# Patient Record
Sex: Female | Born: 1970 | Race: Black or African American | Hispanic: No | Marital: Married | Smoking: Never smoker
Health system: Southern US, Community
[De-identification: ages and names within clinical notes are randomized; demographics above are authoritative.]

## PROBLEM LIST (undated history)

## (undated) DIAGNOSIS — N12 Tubulo-interstitial nephritis, not specified as acute or chronic: Secondary | ICD-10-CM

## (undated) DIAGNOSIS — G43909 Migraine, unspecified, not intractable, without status migrainosus: Secondary | ICD-10-CM

## (undated) HISTORY — PX: WISDOM TOOTH EXTRACTION: SHX21

## (undated) HISTORY — PX: TUBAL LIGATION: SHX77

---

## 2003-03-03 ENCOUNTER — Other Ambulatory Visit: Admission: RE | Admit: 2003-03-03 | Discharge: 2003-03-03 | Payer: Self-pay | Admitting: Internal Medicine

## 2004-08-08 ENCOUNTER — Ambulatory Visit: Payer: Self-pay | Admitting: Internal Medicine

## 2005-05-14 ENCOUNTER — Emergency Department (HOSPITAL_COMMUNITY): Admission: EM | Admit: 2005-05-14 | Discharge: 2005-05-14 | Payer: Self-pay | Admitting: Emergency Medicine

## 2006-02-19 ENCOUNTER — Other Ambulatory Visit: Admission: RE | Admit: 2006-02-19 | Discharge: 2006-02-19 | Payer: Self-pay | Admitting: Internal Medicine

## 2006-06-09 ENCOUNTER — Emergency Department (HOSPITAL_COMMUNITY): Admission: EM | Admit: 2006-06-09 | Discharge: 2006-06-09 | Payer: Self-pay | Admitting: Family Medicine

## 2007-11-07 ENCOUNTER — Emergency Department (HOSPITAL_COMMUNITY): Admission: EM | Admit: 2007-11-07 | Discharge: 2007-11-07 | Payer: Self-pay | Admitting: Obstetrics & Gynecology

## 2007-12-26 ENCOUNTER — Other Ambulatory Visit: Admission: RE | Admit: 2007-12-26 | Discharge: 2007-12-26 | Payer: Self-pay | Admitting: Internal Medicine

## 2008-02-20 ENCOUNTER — Ambulatory Visit (HOSPITAL_COMMUNITY): Admission: RE | Admit: 2008-02-20 | Discharge: 2008-02-20 | Payer: Self-pay | Admitting: Internal Medicine

## 2009-06-11 ENCOUNTER — Ambulatory Visit: Payer: Self-pay | Admitting: Internal Medicine

## 2009-07-07 ENCOUNTER — Ambulatory Visit (HOSPITAL_COMMUNITY): Admission: RE | Admit: 2009-07-07 | Discharge: 2009-07-07 | Payer: Self-pay | Admitting: Internal Medicine

## 2009-07-20 ENCOUNTER — Ambulatory Visit: Payer: Self-pay | Admitting: Internal Medicine

## 2009-07-20 ENCOUNTER — Other Ambulatory Visit: Admission: RE | Admit: 2009-07-20 | Discharge: 2009-07-20 | Payer: Self-pay | Admitting: Internal Medicine

## 2010-07-15 ENCOUNTER — Ambulatory Visit: Payer: Self-pay | Admitting: Internal Medicine

## 2010-07-15 ENCOUNTER — Other Ambulatory Visit: Admission: RE | Admit: 2010-07-15 | Discharge: 2010-07-15 | Payer: Self-pay | Admitting: Internal Medicine

## 2010-08-09 ENCOUNTER — Ambulatory Visit (HOSPITAL_COMMUNITY)
Admission: RE | Admit: 2010-08-09 | Discharge: 2010-08-09 | Payer: Self-pay | Source: Home / Self Care | Admitting: Internal Medicine

## 2011-01-13 ENCOUNTER — Telehealth: Payer: Self-pay | Admitting: Pulmonary Disease

## 2011-01-13 NOTE — Telephone Encounter (Signed)
Error.Andrea Wagner ° ° °

## 2012-02-13 ENCOUNTER — Other Ambulatory Visit: Payer: BC Managed Care – PPO | Admitting: Internal Medicine

## 2012-02-13 DIAGNOSIS — Z0000 Encounter for general adult medical examination without abnormal findings: Secondary | ICD-10-CM

## 2012-02-13 DIAGNOSIS — E559 Vitamin D deficiency, unspecified: Secondary | ICD-10-CM

## 2012-02-13 LAB — LIPID PANEL: Cholesterol: 168 mg/dL (ref 0–200)

## 2012-02-13 LAB — TSH: TSH: 0.694 u[IU]/mL (ref 0.350–4.500)

## 2012-02-13 LAB — CBC WITH DIFFERENTIAL/PLATELET
Basophils Absolute: 0 10*3/uL (ref 0.0–0.1)
Basophils Relative: 0 % (ref 0–1)
Hemoglobin: 12.7 g/dL (ref 12.0–15.0)
MCHC: 33.1 g/dL (ref 30.0–36.0)
Monocytes Relative: 9 % (ref 3–12)
Neutro Abs: 4.6 10*3/uL (ref 1.7–7.7)
Neutrophils Relative %: 68 % (ref 43–77)
RDW: 13.8 % (ref 11.5–15.5)
WBC: 6.7 10*3/uL (ref 4.0–10.5)

## 2012-02-14 LAB — COMPREHENSIVE METABOLIC PANEL
AST: 15 U/L (ref 0–37)
Albumin: 4 g/dL (ref 3.5–5.2)
BUN: 11 mg/dL (ref 6–23)
CO2: 30 mEq/L (ref 19–32)
Calcium: 9.4 mg/dL (ref 8.4–10.5)
Chloride: 106 mEq/L (ref 96–112)
Potassium: 4.6 mEq/L (ref 3.5–5.3)

## 2012-02-15 ENCOUNTER — Ambulatory Visit (INDEPENDENT_AMBULATORY_CARE_PROVIDER_SITE_OTHER): Payer: BC Managed Care – PPO | Admitting: Internal Medicine

## 2012-02-15 ENCOUNTER — Encounter: Payer: Self-pay | Admitting: Internal Medicine

## 2012-02-15 ENCOUNTER — Other Ambulatory Visit (HOSPITAL_COMMUNITY)
Admission: RE | Admit: 2012-02-15 | Discharge: 2012-02-15 | Disposition: A | Discharge status: Home / Self Care | Payer: BC Managed Care – PPO | Source: Ambulatory Visit | Attending: Internal Medicine | Admitting: Internal Medicine

## 2012-02-15 VITALS — BP 96/76 | HR 84 | Temp 98.9°F | Ht 65.5 in | Wt 203.0 lb

## 2012-02-15 DIAGNOSIS — Z124 Encounter for screening for malignant neoplasm of cervix: Secondary | ICD-10-CM

## 2012-02-15 DIAGNOSIS — Z01419 Encounter for gynecological examination (general) (routine) without abnormal findings: Secondary | ICD-10-CM | POA: Insufficient documentation

## 2012-02-15 DIAGNOSIS — E559 Vitamin D deficiency, unspecified: Secondary | ICD-10-CM

## 2012-02-15 DIAGNOSIS — Z8669 Personal history of other diseases of the nervous system and sense organs: Secondary | ICD-10-CM

## 2012-02-15 DIAGNOSIS — Z8639 Personal history of other endocrine, nutritional and metabolic disease: Secondary | ICD-10-CM

## 2012-02-15 DIAGNOSIS — Z87898 Personal history of other specified conditions: Secondary | ICD-10-CM

## 2012-02-15 DIAGNOSIS — Z0000 Encounter for general adult medical examination without abnormal findings: Secondary | ICD-10-CM

## 2012-02-15 LAB — POCT URINALYSIS DIPSTICK
Bilirubin, UA: NEGATIVE
Ketones, UA: NEGATIVE
Leukocytes, UA: NEGATIVE

## 2012-02-29 ENCOUNTER — Telehealth: Payer: Self-pay | Admitting: Internal Medicine

## 2012-02-29 NOTE — Telephone Encounter (Signed)
Recent pap smear revealed trichomonads: treat pt and husband with Flagyl 500 mg twice daily x 7 days #14. Husband is Cherokee Boccio DOB 10/06/72. Called Rx to [facility].

## 2012-03-05 ENCOUNTER — Encounter: Payer: Self-pay | Admitting: Internal Medicine

## 2012-03-05 DIAGNOSIS — Z8669 Personal history of other diseases of the nervous system and sense organs: Secondary | ICD-10-CM | POA: Insufficient documentation

## 2012-03-05 DIAGNOSIS — E559 Vitamin D deficiency, unspecified: Secondary | ICD-10-CM | POA: Insufficient documentation

## 2012-03-05 NOTE — Patient Instructions (Addendum)
Take Drisdol 50,000 units weekly for 12 weeks and take vitamin D 3 2000 units daily. Have annual mammogram. Take Fioricet sparingly for migraine headaches. Return in one year

## 2012-03-05 NOTE — Progress Notes (Signed)
  Subjective:    Patient ID: Andrea Wagner, female    DOB: 01/11/1971, 41 y.o.   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 History of migraine headaches, irritable bowel syndrome, GE reflux and vitamin D deficiency. She works for [employer]. Menstrual periods are irregular always last 3-5 days and she is passing some heavy clots. Says she had tetanus immunization in 2004. Migraines are under fairly good control. Has maybe one migraine a month. Takes Fioricet for migraines. Sulfa causes a rash.  Patient had tubal ligation 1997. Social history married has one son and one daughter. Son has Crohn's disease. Patient does not smoke or consume alcohol.  Family history: Father with history of stroke hypertension and diabetes. Mother with history of arteritis and hypertension.  Review of systems: Has seen Dr. Thurston Hole for knee pain in the past. History of chondromalacia and varicose veins.    Review of Systems  Constitutional: Positive for fatigue.  HENT: Negative.   Eyes: Negative.   Respiratory: Negative.   Cardiovascular: Negative.   Gastrointestinal:       History of GE reflux  Genitourinary:       History of heavy menses  Neurological:       History of migraine headaches  Hematological: Negative.   Psychiatric/Behavioral: Negative.        Objective:   Physical Exam  Vitals reviewed. Constitutional: She is oriented to person, place, and time. She appears well-developed and well-nourished. No distress.  HENT:  Head: Normocephalic and atraumatic.  Right Ear: External ear normal.  Left Ear: External ear normal.  Nose: Nose normal.  Mouth/Throat: Oropharynx is clear and moist. No oropharyngeal exudate.  Eyes: Conjunctivae and EOM are normal. Pupils are equal, round, and reactive to light. Right eye exhibits no discharge. Left eye exhibits no discharge. No scleral icterus.  Neck: Normal range of motion. Neck supple. No JVD present.  No tracheal deviation present. No thyromegaly present.  Cardiovascular: Normal rate, regular rhythm, normal heart sounds and intact distal pulses.   No murmur heard. Pulmonary/Chest: Effort normal and breath sounds normal. She has no wheezes. She has no rales.       Breasts normal female  Abdominal: Soft. Bowel sounds are normal. She exhibits no distension and no mass. There is no tenderness. There is no rebound and no guarding.  Genitourinary: Vagina normal and uterus normal.       Pap taken  Musculoskeletal: She exhibits no edema.  Lymphadenopathy:    She has no cervical adenopathy.  Neurological: She is alert and oriented to person, place, and time. She has normal reflexes. No cranial nerve deficit. Coordination normal.  Skin: Skin is warm and dry. No rash noted. She is not diaphoretic.  Psychiatric: She has a normal mood and affect. Her behavior is normal. Judgment and thought content normal.          Assessment & Plan:  History of migraine headaches  History of vitamin D deficiency  Plan: Patient is vitamin D deficient at the present time recommend 50,000 units weekly for 12 weeks in 2000 units daily. Refilled Fioricet for migraine headaches. Return one year or as needed. Recommend annual mammogram beginning nail. Patient had tetanus immunization 03/03/2003. Gets annual influenza immunization through employment. Had mammogram December 2011.

## 2012-05-02 ENCOUNTER — Other Ambulatory Visit: Payer: Self-pay | Admitting: Internal Medicine

## 2012-05-02 DIAGNOSIS — Z1231 Encounter for screening mammogram for malignant neoplasm of breast: Secondary | ICD-10-CM

## 2012-05-10 ENCOUNTER — Ambulatory Visit (HOSPITAL_COMMUNITY)
Admission: RE | Admit: 2012-05-10 | Discharge: 2012-05-10 | Disposition: A | Discharge status: Home / Self Care | Payer: BC Managed Care – PPO | Source: Ambulatory Visit | Attending: Internal Medicine | Admitting: Internal Medicine

## 2012-05-10 DIAGNOSIS — Z1231 Encounter for screening mammogram for malignant neoplasm of breast: Secondary | ICD-10-CM | POA: Insufficient documentation

## 2013-06-03 ENCOUNTER — Other Ambulatory Visit: Payer: BC Managed Care – PPO | Admitting: Internal Medicine

## 2013-06-03 DIAGNOSIS — Z130 Encounter for screening for diseases of the blood and blood-forming organs and certain disorders involving the immune mechanism: Secondary | ICD-10-CM

## 2013-06-03 DIAGNOSIS — Z13220 Encounter for screening for lipoid disorders: Secondary | ICD-10-CM

## 2013-06-03 DIAGNOSIS — Z1329 Encounter for screening for other suspected endocrine disorder: Secondary | ICD-10-CM

## 2013-06-03 DIAGNOSIS — Z0000 Encounter for general adult medical examination without abnormal findings: Secondary | ICD-10-CM

## 2013-06-03 LAB — CBC WITH DIFFERENTIAL/PLATELET
Basophils Absolute: 0 10*3/uL (ref 0.0–0.1)
HCT: 36.2 % (ref 36.0–46.0)
Hemoglobin: 12.3 g/dL (ref 12.0–15.0)
Lymphocytes Relative: 12 % (ref 12–46)
Monocytes Absolute: 0.6 10*3/uL (ref 0.1–1.0)
Monocytes Relative: 9 % (ref 3–12)
Neutro Abs: 5.7 10*3/uL (ref 1.7–7.7)
RDW: 14.3 % (ref 11.5–15.5)
WBC: 7.2 10*3/uL (ref 4.0–10.5)

## 2013-06-03 LAB — COMPREHENSIVE METABOLIC PANEL
AST: 12 U/L (ref 0–37)
Albumin: 4.1 g/dL (ref 3.5–5.2)
BUN: 9 mg/dL (ref 6–23)
Calcium: 9.1 mg/dL (ref 8.4–10.5)
Chloride: 105 mEq/L (ref 96–112)
Potassium: 3.9 mEq/L (ref 3.5–5.3)
Total Protein: 6.6 g/dL (ref 6.0–8.3)

## 2013-06-03 LAB — LIPID PANEL: HDL: 77 mg/dL (ref >39)

## 2013-06-03 LAB — TSH: TSH: 0.783 u[IU]/mL (ref 0.350–4.500)

## 2013-06-04 LAB — VITAMIN D 25 HYDROXY (VIT D DEFICIENCY, FRACTURES): Vit D, 25-Hydroxy: 34 ng/mL (ref 30–89)

## 2013-06-09 ENCOUNTER — Encounter: Payer: Self-pay | Admitting: Internal Medicine

## 2013-06-09 ENCOUNTER — Other Ambulatory Visit (HOSPITAL_COMMUNITY)
Admission: RE | Admit: 2013-06-09 | Discharge: 2013-06-09 | Disposition: A | Discharge status: Home / Self Care | Payer: BC Managed Care – PPO | Source: Ambulatory Visit | Attending: Internal Medicine | Admitting: Internal Medicine

## 2013-06-09 ENCOUNTER — Ambulatory Visit (INDEPENDENT_AMBULATORY_CARE_PROVIDER_SITE_OTHER): Payer: BC Managed Care – PPO | Admitting: Internal Medicine

## 2013-06-09 VITALS — BP 116/86 | HR 80 | Temp 98.6°F | Ht 66.5 in | Wt 200.0 lb

## 2013-06-09 DIAGNOSIS — Z01419 Encounter for gynecological examination (general) (routine) without abnormal findings: Secondary | ICD-10-CM | POA: Insufficient documentation

## 2013-06-09 DIAGNOSIS — M25569 Pain in unspecified knee: Secondary | ICD-10-CM

## 2013-06-09 DIAGNOSIS — M25561 Pain in right knee: Secondary | ICD-10-CM | POA: Insufficient documentation

## 2013-06-09 DIAGNOSIS — Z8669 Personal history of other diseases of the nervous system and sense organs: Secondary | ICD-10-CM

## 2013-06-09 DIAGNOSIS — Z0000 Encounter for general adult medical examination without abnormal findings: Secondary | ICD-10-CM

## 2013-06-09 DIAGNOSIS — Z8639 Personal history of other endocrine, nutritional and metabolic disease: Secondary | ICD-10-CM

## 2013-06-09 LAB — POCT URINALYSIS DIPSTICK
Glucose, UA: NEGATIVE
Leukocytes, UA: NEGATIVE
Nitrite, UA: NEGATIVE
Spec Grav, UA: 1.025
Urobilinogen, UA: NEGATIVE

## 2013-06-09 MED ORDER — BUTALBITAL-APAP-CAFFEINE 50-325-40 MG PO TABS: 1.0000 | ORAL_TABLET | Freq: Two times a day (BID) | ORAL | Status: DC | PRN

## 2013-06-09 MED ORDER — IBUPROFEN 800 MG PO TABS: 800.0000 mg | ORAL_TABLET | Freq: Three times a day (TID) | ORAL | Status: DC | PRN

## 2013-06-09 NOTE — Patient Instructions (Addendum)
Return in one year. Have refilled Fioricet and ibuprofen.

## 2013-06-09 NOTE — Progress Notes (Signed)
  Subjective:    Patient ID: Andrea Wagner, female    DOB: [birth year], 42 y.o.   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 She has a history of migraine headaches. Usually can take Excedrin migraine for relief but sometimes has to take Fioricet. Fioricet was refilled today. History of vitamin D deficiency. Recently went on cruise. Doesn't remember to take vitamin D 3 on a daily basis. Her vitamin D level is normal. Suggested once again she take vitamin D 3 2000 units daily. Complains of fatigue but when I review her schedule with her, I can understand why. She's taking classes at [facility] [employer] as prerequisites for obtaining her BSN. She works every other weekend at [employer]. She works full-time for [employer]. She also has a family.  Past medical history: Patient had tubal ligation 1997. Has seen Dr. Thurston Hole for knee pain in the past with history of chondromalacia patella and varicose veins.  Social history: Married. Has one son and one daughter. Son has Crohn's disease. Patient does not smoke or consume alcohol.  Family history: Father with history of stroke, hypertension, diabetes. Mother with history of arthritis and hypertension    Review of Systems  Constitutional: Positive for fatigue.  HENT: Negative.   Eyes: Negative.   Respiratory: Negative.   Cardiovascular: Negative.   Gastrointestinal: Negative.   Endocrine: Negative.   Genitourinary: Negative.   Musculoskeletal:       Bilateral knee pain. History of chondromalacia patella.  Allergic/Immunologic: Negative.   Neurological:       History of migraine headaches  Hematological: Negative.   Psychiatric/Behavioral: Negative.        Objective:   Physical Exam  Vitals reviewed. Constitutional: She is oriented to person, place, and time. She appears well-developed and well-nourished. No distress.  HENT:  Head:  Atraumatic.  Right Ear: External ear normal.  Left Ear: External ear normal.  Mouth/Throat: Oropharynx is clear and moist. No oropharyngeal exudate.  Eyes: Conjunctivae and EOM are normal. Pupils are equal, round, and reactive to light. Right eye exhibits no discharge. Left eye exhibits no discharge. No scleral icterus.  Neck: Neck supple. No JVD present. No thyromegaly present.  Cardiovascular: Normal rate, regular rhythm, normal heart sounds and intact distal pulses.   No murmur heard. Pulmonary/Chest: Effort normal and breath sounds normal. No respiratory distress. She has no wheezes. She has no rales. She exhibits no tenderness.  Breasts normal female without masses  Abdominal: Soft. Bowel sounds are normal. She exhibits no distension and no mass. There is no tenderness. There is no rebound and no guarding.  Genitourinary: Vagina normal and uterus normal.  Pap taken  Musculoskeletal: Normal range of motion. She exhibits no edema.  Lymphadenopathy:    She has no cervical adenopathy.  Neurological: She is alert and oriented to person, place, and time. She has normal reflexes. No cranial nerve deficit. Coordination normal.  Skin: Skin is warm and dry. No rash noted. She is not diaphoretic.  Psychiatric: She has a normal mood and affect. Her behavior is normal. Judgment and thought content normal.          Assessment & Plan:  History of migraine headaches-refill Fioricet as described above  Fatigue  History of vitamin D deficiency-take vitamin D supplement over-the-counter as directed  Bilateral knee pain-refill ibuprofen  Plan: Return in one year or as needed

## 2013-07-18 ENCOUNTER — Emergency Department (HOSPITAL_COMMUNITY)
Admission: EM | Admit: 2013-07-18 | Discharge: 2013-07-18 | Disposition: A | Discharge status: Home / Self Care | Payer: No Typology Code available for payment source | Attending: Emergency Medicine | Admitting: Emergency Medicine

## 2013-07-18 DIAGNOSIS — S0003XA Contusion of scalp, initial encounter: Secondary | ICD-10-CM | POA: Insufficient documentation

## 2013-07-18 DIAGNOSIS — Y92410 Unspecified street and highway as the place of occurrence of the external cause: Secondary | ICD-10-CM | POA: Insufficient documentation

## 2013-07-18 DIAGNOSIS — S060X0A Concussion without loss of consciousness, initial encounter: Secondary | ICD-10-CM | POA: Insufficient documentation

## 2013-07-18 DIAGNOSIS — IMO0002 Reserved for concepts with insufficient information to code with codable children: Secondary | ICD-10-CM | POA: Insufficient documentation

## 2013-07-18 DIAGNOSIS — S0993XA Unspecified injury of face, initial encounter: Secondary | ICD-10-CM | POA: Insufficient documentation

## 2013-07-18 DIAGNOSIS — Y9389 Activity, other specified: Secondary | ICD-10-CM | POA: Insufficient documentation

## 2013-07-18 MED ORDER — ONDANSETRON 4 MG PO TBDP
4.0000 mg | ORAL_TABLET | Freq: Once | ORAL | Status: AC
  Administered 2013-07-18: 4 mg via ORAL
  Filled 2013-07-18: qty 1

## 2013-07-18 MED ORDER — HYDROCODONE-ACETAMINOPHEN 5-325 MG PO TABS
2.0000 | ORAL_TABLET | Freq: Once | ORAL | Status: AC
  Administered 2013-07-18: 2 via ORAL
  Filled 2013-07-18: qty 2
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
## 2013-07-18 NOTE — ED Notes (Signed)
Pt has a ride home.  

## 2013-07-18 NOTE — ED Notes (Signed)
Pt sitting in darkened room. States she is having a headache with slight nausea. Pt also states her lower back is beginning to throb and that her shoulders feel tight. Pt a/o x 4.
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
## 2013-07-19 NOTE — ED Provider Notes (Signed)
Medical screening examination/treatment/procedure(s) were performed by non-physician practitioner and as supervising physician I was immediately available for consultation/collaboration.    Posey Petrik R Mahli Glahn, MD 07/19/13 0009 

## 2013-07-21 ENCOUNTER — Encounter: Payer: Self-pay | Admitting: Internal Medicine

## 2013-07-21 ENCOUNTER — Ambulatory Visit
Admission: RE | Admit: 2013-07-21 | Discharge: 2013-07-21 | Disposition: A | Discharge status: Home / Self Care | Payer: Self-pay | Source: Ambulatory Visit | Attending: Internal Medicine | Admitting: Internal Medicine

## 2013-07-21 ENCOUNTER — Ambulatory Visit (INDEPENDENT_AMBULATORY_CARE_PROVIDER_SITE_OTHER): Payer: BC Managed Care – PPO | Admitting: Internal Medicine

## 2013-07-21 VITALS — BP 112/82 | HR 72 | Temp 97.9°F | Ht 66.0 in | Wt 197.0 lb

## 2013-07-21 DIAGNOSIS — M542 Cervicalgia: Secondary | ICD-10-CM

## 2013-07-21 DIAGNOSIS — M25519 Pain in unspecified shoulder: Secondary | ICD-10-CM

## 2013-07-21 DIAGNOSIS — M62838 Other muscle spasm: Secondary | ICD-10-CM

## 2013-07-21 DIAGNOSIS — Z5189 Encounter for other specified aftercare: Secondary | ICD-10-CM

## 2013-07-21 DIAGNOSIS — M25511 Pain in right shoulder: Secondary | ICD-10-CM

## 2013-07-21 DIAGNOSIS — R51 Headache: Secondary | ICD-10-CM

## 2013-07-21 DIAGNOSIS — S0990XD Unspecified injury of head, subsequent encounter: Secondary | ICD-10-CM

## 2013-07-21 MED ORDER — HYDROCODONE-ACETAMINOPHEN 5-325 MG PO TABS: 1.0000 | ORAL_TABLET | Freq: Four times a day (QID) | ORAL | Status: DC | PRN

## 2013-07-21 MED ORDER — CYCLOBENZAPRINE HCL 10 MG PO TABS: ORAL_TABLET | ORAL | Status: DC
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
## 2013-08-01 NOTE — Patient Instructions (Signed)
Take hydrocodone/APAP as directed. Take Flexeril at bedtime. Have cervical spine films done. Apply ice to neck spasm 20 minutes once or twice daily. Call if not better in 7-10 days or sooner if worse.

## 2013-10-14 ENCOUNTER — Telehealth: Payer: Self-pay | Admitting: Internal Medicine
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
## 2014-03-29 ENCOUNTER — Emergency Department (INDEPENDENT_AMBULATORY_CARE_PROVIDER_SITE_OTHER)
Admission: EM | Admit: 2014-03-29 | Discharge: 2014-03-29 | Disposition: A | Discharge status: Home / Self Care | Payer: BC Managed Care – PPO | Source: Home / Self Care | Attending: Family Medicine | Admitting: Family Medicine

## 2014-03-29 ENCOUNTER — Encounter (HOSPITAL_COMMUNITY): Payer: Self-pay | Admitting: Emergency Medicine

## 2014-03-29 DIAGNOSIS — M722 Plantar fascial fibromatosis: Secondary | ICD-10-CM | POA: Diagnosis present

## 2014-03-29 DIAGNOSIS — Q667 Congenital pes cavus, unspecified foot: Secondary | ICD-10-CM

## 2014-03-29 NOTE — ED Notes (Signed)
Patient complains of pain to her left heel that started about Four days ago and has gotten progressively worse The foot is slightly swollen around the ankle and the pain is a throbbing Pain Denies any injury to her foot

## 2014-03-29 NOTE — Discharge Instructions (Signed)
Thank you for coming in today. Get some gel heel cups Wear cushioned shoes.  Do the stretch and ice massage like we talked about.  Do the heel raise exercises like we talked about. Do 30 reps severe times a day. Remember to go down slowly.  Follow up with Dr. Katrinka BlazingSmith to consider orthotics.    Plantar Fasciitis (Heel Spur Syndrome) with Rehab The plantar fascia is a fibrous, ligament-like, soft-tissue structure that spans the bottom of the foot. Plantar fasciitis is a condition that causes pain in the foot due to inflammation of the tissue. SYMPTOMS   Pain and tenderness on the underneath side of the foot.  Pain that worsens with standing or walking. CAUSES  Plantar fasciitis is caused by irritation and injury to the plantar fascia on the underneath side of the foot. Common mechanisms of injury include:  Direct trauma to bottom of the foot.  Damage to a small nerve that runs under the foot where the main fascia attaches to the heel bone.  Stress placed on the plantar fascia due to bone spurs. RISK INCREASES WITH:   Activities that place stress on the plantar fascia (running, jumping, pivoting, or cutting).  Poor strength and flexibility.  Improperly fitted shoes.  Tight calf muscles.  Flat feet.  Failure to warm-up properly before activity.  Obesity. PREVENTION  Warm up and stretch properly before activity.  Allow for adequate recovery between workouts.  Maintain physical fitness:  Strength, flexibility, and endurance.  Cardiovascular fitness.  Maintain a health body weight.  Avoid stress on the plantar fascia.  Wear properly fitted shoes, including arch supports for individuals who have flat feet. PROGNOSIS  If treated properly, then the symptoms of plantar fasciitis usually resolve without surgery. However, occasionally surgery is necessary. RELATED COMPLICATIONS   Recurrent symptoms that may result in a chronic condition.  Problems of the lower back that  are caused by compensating for the injury, such as limping.  Pain or weakness of the foot during push-off following surgery.  Chronic inflammation, scarring, and partial or complete fascia tear, occurring more often from repeated injections. TREATMENT  Treatment initially involves the use of ice and medication to help reduce pain and inflammation. The use of strengthening and stretching exercises may help reduce pain with activity, especially stretches of the Achilles tendon. These exercises may be performed at home or with a therapist. Your caregiver may recommend that you use heel cups of arch supports to help reduce stress on the plantar fascia. Occasionally, corticosteroid injections are given to reduce inflammation. If symptoms persist for greater than 6 months despite non-surgical (conservative), then surgery may be recommended.  MEDICATION   If pain medication is necessary, then nonsteroidal anti-inflammatory medications, such as aspirin and ibuprofen, or other minor pain relievers, such as acetaminophen, are often recommended.  Do not take pain medication within 7 days before surgery.  Prescription pain relievers may be given if deemed necessary by your caregiver. Use only as directed and only as much as you need.  Corticosteroid injections may be given by your caregiver. These injections should be reserved for the most serious cases, because they may only be given a certain number of times. HEAT AND COLD  Cold treatment (icing) relieves pain and reduces inflammation. Cold treatment should be applied for 10 to 15 minutes every 2 to 3 hours for inflammation and pain and immediately after any activity that aggravates your symptoms. Use ice packs or massage the area with a piece of ice (ice massage).  Heat treatment may be used prior to performing the stretching and strengthening activities prescribed by your caregiver, physical therapist, or athletic trainer. Use a heat pack or soak the  injury in warm water. SEEK IMMEDIATE MEDICAL CARE IF:  Treatment seems to offer no benefit, or the condition worsens.  Any medications produce adverse side effects. EXERCISES RANGE OF MOTION (ROM) AND STRETCHING EXERCISES - Plantar Fasciitis (Heel Spur Syndrome) These exercises may help you when beginning to rehabilitate your injury. Your symptoms may resolve with or without further involvement from your physician, physical therapist or athletic trainer. While completing these exercises, remember:   Restoring tissue flexibility helps normal motion to return to the joints. This allows healthier, less painful movement and activity.  An effective stretch should be held for at least 30 seconds.  A stretch should never be painful. You should only feel a gentle lengthening or release in the stretched tissue. RANGE OF MOTION - Toe Extension, Flexion  Sit with your right / left leg crossed over your opposite knee.  Grasp your toes and gently pull them back toward the top of your foot. You should feel a stretch on the bottom of your toes and/or foot.  Hold this stretch for __________ seconds.  Now, gently pull your toes toward the bottom of your foot. You should feel a stretch on the top of your toes and or foot.  Hold this stretch for __________ seconds. Repeat __________ times. Complete this stretch __________ times per day.  RANGE OF MOTION - Ankle Dorsiflexion, Active Assisted  Remove shoes and sit on a chair that is preferably not on a carpeted surface.  Place right / left foot under knee. Extend your opposite leg for support.  Keeping your heel down, slide your right / left foot back toward the chair until you feel a stretch at your ankle or calf. If you do not feel a stretch, slide your bottom forward to the edge of the chair, while still keeping your heel down.  Hold this stretch for __________ seconds. Repeat __________ times. Complete this stretch __________ times per day.    STRETCH - Gastroc, Standing  Place hands on wall.  Extend right / left leg, keeping the front knee somewhat bent.  Slightly point your toes inward on your back foot.  Keeping your right / left heel on the floor and your knee straight, shift your weight toward the wall, not allowing your back to arch.  You should feel a gentle stretch in the right / left calf. Hold this position for __________ seconds. Repeat __________ times. Complete this stretch __________ times per day. STRETCH - Soleus, Standing  Place hands on wall.  Extend right / left leg, keeping the other knee somewhat bent.  Slightly point your toes inward on your back foot.  Keep your right / left heel on the floor, bend your back knee, and slightly shift your weight over the back leg so that you feel a gentle stretch deep in your back calf.  Hold this position for __________ seconds. Repeat __________ times. Complete this stretch __________ times per day. STRETCH - Gastrocsoleus, Standing  Note: This exercise can place a lot of stress on your foot and ankle. Please complete this exercise only if specifically instructed by your caregiver.   Place the ball of your right / left foot on a step, keeping your other foot firmly on the same step.  Hold on to the wall or a rail for balance.  Slowly lift your other  foot, allowing your body weight to press your heel down over the edge of the step.  You should feel a stretch in your right / left calf.  Hold this position for __________ seconds.  Repeat this exercise with a slight bend in your right / left knee. Repeat __________ times. Complete this stretch __________ times per day.  STRENGTHENING EXERCISES - Plantar Fasciitis (Heel Spur Syndrome)  These exercises may help you when beginning to rehabilitate your injury. They may resolve your symptoms with or without further involvement from your physician, physical therapist or athletic trainer. While completing these  exercises, remember:   Muscles can gain both the endurance and the strength needed for everyday activities through controlled exercises.  Complete these exercises as instructed by your physician, physical therapist or athletic trainer. Progress the resistance and repetitions only as guided. STRENGTH - Towel Curls  Sit in a chair positioned on a non-carpeted surface.  Place your foot on a towel, keeping your heel on the floor.  Pull the towel toward your heel by only curling your toes. Keep your heel on the floor.  If instructed by your physician, physical therapist or athletic trainer, add ____________________ at the end of the towel. Repeat __________ times. Complete this exercise __________ times per day. STRENGTH - Ankle Inversion  Secure one end of a rubber exercise band/tubing to a fixed object (table, pole). Loop the other end around your foot just before your toes.  Place your fists between your knees. This will focus your strengthening at your ankle.  Slowly, pull your big toe up and in, making sure the band/tubing is positioned to resist the entire motion.  Hold this position for __________ seconds.  Have your muscles resist the band/tubing as it slowly pulls your foot back to the starting position. Repeat __________ times. Complete this exercises __________ times per day.  Document Released: 08/21/2005 Document Revised: 11/13/2011 Document Reviewed: 12/03/2008 [facility] Patient Information 2015 [place], [place]. This information is not intended to replace advice given to you by your health care provider. Make sure you discuss any questions you have with your health care provider.

## 2014-03-29 NOTE — ED Provider Notes (Signed)
Andrea Wagner is a 43 y.o. female who presents to Urgent Care today for left heel pain. Patient has a 4 to five-day history of plantar left heel pain. Patient denies any injury. The pain is worse with walking and prolonged standing. She denies any radiating pain weakness or numbness. She's tried ibuprofen rest and elevation which helped only a little. No fevers or chills nausea vomiting or diarrhea.   History reviewed. No pertinent past medical history. History  Substance Use Topics  . Smoking status: Never Smoker   . Smokeless tobacco: Never Used  . Alcohol Use: Yes     Comment: rarely   ROS as above Medications: No current facility-administered medications for this encounter.   Current Outpatient Prescriptions  Medication Sig Dispense Refill  . aspirin-acetaminophen-caffeine (EXCEDRIN MIGRAINE) 250-250-65 MG per tablet Take 1 tablet by mouth every 6 (six) hours as needed (migraine).       . cyclobenzaprine (FLEXERIL) 10 MG tablet 1/2  To one tab po hs  30 tablet  1  . HYDROcodone-acetaminophen (NORCO) 5-325 MG per tablet Take 1 tablet by mouth every 6 (six) hours as needed for moderate pain.  60 tablet  0  . ibuprofen (ADVIL,MOTRIN) 800 MG tablet Take 1 tablet (800 mg total) by mouth every 8 (eight) hours as needed for pain.  90 tablet  3    Exam:  BP 125/78  Pulse 79  Temp(Src) 99.1 F (37.3 C) (Oral)  Resp 16  SpO2 97% Gen: Well NAD Left foot:  Cavus appearing foot without any other significant abnormalities or erythema. Tender palpation medial plantar calcaneus Foot is nontender otherwise Peripheral pulses and sensation are intact Normal foot motion  Contralateral right foot is also cavus but nontender without other abnormalities capillary refill sensation and pulses are also intact on the right side  No results found for this or any previous visit (from the past 24 hour(s)). No results found.  Assessment and Plan: 43 y.o. female with left plantar fasciitis. Plan  for heel cups, supportive shoes, ice massage, and eccentric calf exercises. Patient has a cavus foot which increases her probability of injury.  Recommend followup with sports medicine for consideration of orthotics.   Discussed warning signs or symptoms. Please see discharge instructions. Patient expresses understanding.   This note was created using Conservation officer, historic buildingsDragon voice recognition software. Any transcription errors are unintended.    Rodolph BongEvan S Irl Bodie, MD 03/29/14 [id number]

## 2014-04-30 ENCOUNTER — Ambulatory Visit (HOSPITAL_COMMUNITY)
Admission: RE | Admit: 2014-04-30 | Discharge: 2014-04-30 | Disposition: A | Discharge status: Home / Self Care | Payer: BC Managed Care – PPO | Source: Ambulatory Visit | Attending: Internal Medicine | Admitting: Internal Medicine

## 2014-04-30 ENCOUNTER — Other Ambulatory Visit: Payer: Self-pay | Admitting: Internal Medicine

## 2014-04-30 DIAGNOSIS — Z1231 Encounter for screening mammogram for malignant neoplasm of breast: Secondary | ICD-10-CM | POA: Diagnosis present

## 2014-08-21 ENCOUNTER — Other Ambulatory Visit: Payer: BC Managed Care – PPO | Admitting: Internal Medicine

## 2014-08-21 DIAGNOSIS — Z13220 Encounter for screening for lipoid disorders: Secondary | ICD-10-CM

## 2014-08-21 DIAGNOSIS — Z8639 Personal history of other endocrine, nutritional and metabolic disease: Secondary | ICD-10-CM

## 2014-08-21 DIAGNOSIS — Z0000 Encounter for general adult medical examination without abnormal findings: Secondary | ICD-10-CM

## 2014-08-21 DIAGNOSIS — E559 Vitamin D deficiency, unspecified: Secondary | ICD-10-CM

## 2014-08-21 DIAGNOSIS — Z130 Encounter for screening for diseases of the blood and blood-forming organs and certain disorders involving the immune mechanism: Secondary | ICD-10-CM

## 2014-08-21 DIAGNOSIS — Z1329 Encounter for screening for other suspected endocrine disorder: Secondary | ICD-10-CM

## 2014-08-21 LAB — CBC WITH DIFFERENTIAL/PLATELET
Basophils Absolute: 0 10*3/uL (ref 0.0–0.1)
Basophils Relative: 0 % (ref 0–1)
EOS ABS: 0.1 10*3/uL (ref 0.0–0.7)
EOS PCT: 2 % (ref 0–5)
HEMATOCRIT: 37.9 % (ref 36.0–46.0)
HEMOGLOBIN: 12.4 g/dL (ref 12.0–15.0)
LYMPHS ABS: 1.5 10*3/uL (ref 0.7–4.0)
Lymphocytes Relative: 22 % (ref 12–46)
MCH: 27 pg (ref 26.0–34.0)
MCHC: 32.7 g/dL (ref 30.0–36.0)
MCV: 82.6 fL (ref 78.0–100.0)
MONO ABS: 0.6 10*3/uL (ref 0.1–1.0)
MONOS PCT: 9 % (ref 3–12)
MPV: 11 fL (ref 9.4–12.4)
Neutro Abs: 4.4 10*3/uL (ref 1.7–7.7)
Neutrophils Relative %: 67 % (ref 43–77)
Platelets: 249 10*3/uL (ref 150–400)
RBC: 4.59 MIL/uL (ref 3.87–5.11)
RDW: 14.2 % (ref 11.5–15.5)
WBC: 6.6 10*3/uL (ref 4.0–10.5)

## 2014-08-21 LAB — COMPREHENSIVE METABOLIC PANEL
ALT: 14 U/L (ref 0–35)
AST: 13 U/L (ref 0–37)
Albumin: 3.9 g/dL (ref 3.5–5.2)
Alkaline Phosphatase: 70 U/L (ref 39–117)
BILIRUBIN TOTAL: 0.6 mg/dL (ref 0.2–1.2)
BUN: 12 mg/dL (ref 6–23)
CO2: 26 meq/L (ref 19–32)
Calcium: 9.2 mg/dL (ref 8.4–10.5)
Chloride: 107 mEq/L (ref 96–112)
Creat: 0.87 mg/dL (ref 0.50–1.10)
GLUCOSE: 82 mg/dL (ref 70–99)
Potassium: 4 mEq/L (ref 3.5–5.3)
SODIUM: 138 meq/L (ref 135–145)
TOTAL PROTEIN: 6.7 g/dL (ref 6.0–8.3)

## 2014-08-21 LAB — LIPID PANEL
CHOLESTEROL: 160 mg/dL (ref 0–200)
HDL: 75 mg/dL (ref >39)
LDL Cholesterol: 71 mg/dL (ref 0–99)
TRIGLYCERIDES: 71 mg/dL (ref <150)
Total CHOL/HDL Ratio: 2.1 Ratio
VLDL: 14 mg/dL (ref 0–40)

## 2014-08-21 LAB — TSH: TSH: 0.914 u[IU]/mL (ref 0.350–4.500)

## 2014-08-22 LAB — VITAMIN D 25 HYDROXY (VIT D DEFICIENCY, FRACTURES): Vit D, 25-Hydroxy: 15 ng/mL — ABNORMAL LOW (ref 30–100)

## 2014-08-24 ENCOUNTER — Ambulatory Visit (INDEPENDENT_AMBULATORY_CARE_PROVIDER_SITE_OTHER): Payer: BC Managed Care – PPO | Admitting: Internal Medicine

## 2014-08-24 ENCOUNTER — Encounter: Payer: Self-pay | Admitting: Internal Medicine

## 2014-08-24 VITALS — BP 106/70 | HR 93 | Temp 98.6°F | Wt 214.0 lb

## 2014-08-24 DIAGNOSIS — Z0000 Encounter for general adult medical examination without abnormal findings: Secondary | ICD-10-CM

## 2014-08-24 DIAGNOSIS — E559 Vitamin D deficiency, unspecified: Secondary | ICD-10-CM

## 2014-08-24 DIAGNOSIS — Z23 Encounter for immunization: Secondary | ICD-10-CM

## 2014-08-24 DIAGNOSIS — Z8669 Personal history of other diseases of the nervous system and sense organs: Secondary | ICD-10-CM

## 2014-08-24 LAB — POCT URINALYSIS DIPSTICK
BILIRUBIN UA: NEGATIVE
Blood, UA: NEGATIVE
Glucose, UA: NEGATIVE
KETONES UA: NEGATIVE
LEUKOCYTES UA: NEGATIVE
Nitrite, UA: NEGATIVE
PH UA: 6
Protein, UA: NEGATIVE
Spec Grav, UA: 1.015
Urobilinogen, UA: NEGATIVE

## 2014-08-24 MED ORDER — BUTALBITAL-APAP-CAFF-COD 50-325-40-30 MG PO CAPS: 1.0000 | ORAL_CAPSULE | Freq: Four times a day (QID) | ORAL | Status: DC | PRN

## 2014-08-24 MED ORDER — ERGOCALCIFEROL 1.25 MG (50000 UT) PO CAPS: 50000.0000 [IU] | ORAL_CAPSULE | ORAL | Status: DC

## 2014-08-24 MED ORDER — IBUPROFEN 800 MG PO TABS: 800.0000 mg | ORAL_TABLET | Freq: Three times a day (TID) | ORAL | Status: DC | PRN

## 2014-10-02 ENCOUNTER — Other Ambulatory Visit: Payer: BLUE CROSS/BLUE SHIELD | Admitting: Internal Medicine

## 2014-10-02 DIAGNOSIS — Z0000 Encounter for general adult medical examination without abnormal findings: Secondary | ICD-10-CM

## 2014-10-05 LAB — VARICELLA ZOSTER ANTIBODY, IGG: Varicella IgG: 1547 Index — ABNORMAL HIGH (ref <135.00)

## 2014-11-02 ENCOUNTER — Encounter: Payer: Self-pay | Admitting: Internal Medicine

## 2014-11-02 NOTE — Progress Notes (Signed)
   Subjective:    Patient ID: Andrea Wagner, female    DOB: October 16, 1970, 44 y.o.   MRN: [id number]  HPI Pleasant 44 year old Black Female in today for health maintenance exam.   She has a history of migraine headaches. Usually can take Excedrin migraine for relief but sometimes has take Fioricet. History of vitamin D deficiency.  Past medical history: Patient had tubal ligation 1997. Has seen Dr. Thurston HoleWainer for knee pain in the past with history of chondromalacia patella and varicose veins.  Social history: She is married. Has one son and one daughter. Son has Crohn's disease. Patient does not smoke or consume alcohol.  Family history: Father with history of MI, hypertension, diabetes. Mother with history of arthritis and hypertension    Review of Systems  Constitutional: Positive for fatigue.  Respiratory: Negative.   Cardiovascular: Negative.   Gastrointestinal: Negative.   Musculoskeletal:       Bilateral knee pain from chondromalacia patella  Neurological:       History of migraine headaches       Objective:   Physical Exam  Constitutional: She is oriented to person, place, and time. She appears well-developed and well-nourished. No distress.  HENT:  Head: Normocephalic and atraumatic.  Right Ear: External ear normal.  Mouth/Throat: Oropharynx is clear and moist. No oropharyngeal exudate.  Eyes: Conjunctivae and EOM are normal. Pupils are equal, round, and reactive to light. Right eye exhibits no discharge. Left eye exhibits no discharge. No scleral icterus.  Neck: Neck supple. No JVD present. No thyromegaly present.  Cardiovascular: Normal rate, regular rhythm, normal heart sounds and intact distal pulses.   No murmur heard. Pulmonary/Chest: Effort normal and breath sounds normal. She has no wheezes. She has no rales.  Abdominal: Soft. Bowel sounds are normal. She exhibits no distension and no mass. There is no tenderness. There is no rebound and no guarding.  Genitourinary:    Pap taken 2014. Bimanual normal.  Musculoskeletal: Normal range of motion. She exhibits no edema.  Lymphadenopathy:    She has no cervical adenopathy.  Neurological: She is alert and oriented to person, place, and time. She has normal reflexes. No cranial nerve deficit.  Skin: Skin is warm and dry. No rash noted. She is not diaphoretic.  Psychiatric: She has a normal mood and affect. Her behavior is normal. Judgment and thought content normal.  Vitals reviewed.         Assessment & Plan:  Normal health maintenance exam  History of chondromalacia patella  History of migraine headaches  Vitamin D deficiency-take 50,000 units dressed oh weekly for 12 weeks then 2000 international units vitamin D 3 daily  Plan: Refill migraine medication. Return in one year or as needed. Be sure to take vitamin D supplement

## 2014-11-02 NOTE — Patient Instructions (Signed)
Take 50,000 units of vitamin D weekly then 2000 units daily. Migraine medication refill. Return in one year.

## 2014-11-23 ENCOUNTER — Telehealth: Payer: Self-pay | Admitting: Internal Medicine

## 2014-11-23 ENCOUNTER — Other Ambulatory Visit: Payer: Self-pay | Admitting: Internal Medicine

## 2014-11-23 NOTE — Telephone Encounter (Signed)
Pharmacy requesting refill on Drisdol. She has had 12 weeks of Drisdol weekly. Contacted patient personally to tell her to take 2000 units vitamin D 3 daily over-the-counter nail since she's had the high-dose replacement for 12 weeks. High-dose replacement refill not appropriate at this time

## 2015-06-01 ENCOUNTER — Other Ambulatory Visit: Payer: BLUE CROSS/BLUE SHIELD | Admitting: Internal Medicine

## 2015-06-01 DIAGNOSIS — Z0000 Encounter for general adult medical examination without abnormal findings: Secondary | ICD-10-CM

## 2015-06-01 DIAGNOSIS — Z130 Encounter for screening for diseases of the blood and blood-forming organs and certain disorders involving the immune mechanism: Secondary | ICD-10-CM

## 2015-06-01 DIAGNOSIS — Z1329 Encounter for screening for other suspected endocrine disorder: Secondary | ICD-10-CM

## 2015-06-01 DIAGNOSIS — Z1321 Encounter for screening for nutritional disorder: Secondary | ICD-10-CM

## 2015-06-01 DIAGNOSIS — Z13220 Encounter for screening for lipoid disorders: Secondary | ICD-10-CM

## 2015-06-01 LAB — COMPLETE METABOLIC PANEL WITH GFR
ALT: 15 U/L (ref 6–29)
AST: 16 U/L (ref 10–30)
Albumin: 3.9 g/dL (ref 3.6–5.1)
Alkaline Phosphatase: 66 U/L (ref 33–115)
BUN: 10 mg/dL (ref 7–25)
CHLORIDE: 105 mmol/L (ref 98–110)
CO2: 25 mmol/L (ref 20–31)
Calcium: 9.6 mg/dL (ref 8.6–10.2)
Creat: 0.85 mg/dL (ref 0.50–1.10)
GFR, EST NON AFRICAN AMERICAN: 84 mL/min (ref >=60)
GLUCOSE: 90 mg/dL (ref 65–99)
POTASSIUM: 4.4 mmol/L (ref 3.5–5.3)
SODIUM: 139 mmol/L (ref 135–146)
Total Bilirubin: 0.3 mg/dL (ref 0.2–1.2)
Total Protein: 6.6 g/dL (ref 6.1–8.1)

## 2015-06-01 LAB — LIPID PANEL
CHOL/HDL RATIO: 2.4 ratio (ref <=5.0)
Cholesterol: 170 mg/dL (ref 125–200)
HDL: 72 mg/dL (ref >=46)
LDL CALC: 85 mg/dL (ref <130)
Triglycerides: 65 mg/dL (ref <150)
VLDL: 13 mg/dL (ref <30)

## 2015-06-01 LAB — CBC WITH DIFFERENTIAL/PLATELET
BASOS PCT: 0 % (ref 0–1)
Basophils Absolute: 0 10*3/uL (ref 0.0–0.1)
EOS PCT: 1 % (ref 0–5)
Eosinophils Absolute: 0.1 10*3/uL (ref 0.0–0.7)
HCT: 36.2 % (ref 36.0–46.0)
Hemoglobin: 12.5 g/dL (ref 12.0–15.0)
LYMPHS ABS: 1.4 10*3/uL (ref 0.7–4.0)
Lymphocytes Relative: 19 % (ref 12–46)
MCH: 27.7 pg (ref 26.0–34.0)
MCHC: 34.5 g/dL (ref 30.0–36.0)
MCV: 80.1 fL (ref 78.0–100.0)
MONOS PCT: 8 % (ref 3–12)
MPV: 11.1 fL (ref 8.6–12.4)
Monocytes Absolute: 0.6 10*3/uL (ref 0.1–1.0)
NEUTROS PCT: 72 % (ref 43–77)
Neutro Abs: 5.2 10*3/uL (ref 1.7–7.7)
Platelets: 260 10*3/uL (ref 150–400)
RBC: 4.52 MIL/uL (ref 3.87–5.11)
RDW: 14.3 % (ref 11.5–15.5)
WBC: 7.2 10*3/uL (ref 4.0–10.5)

## 2015-06-01 LAB — TSH: TSH: 0.669 u[IU]/mL (ref 0.350–4.500)

## 2015-06-02 LAB — VITAMIN D 25 HYDROXY (VIT D DEFICIENCY, FRACTURES): VIT D 25 HYDROXY: 24 ng/mL — AB (ref 30–100)

## 2015-07-16 ENCOUNTER — Other Ambulatory Visit: Payer: BLUE CROSS/BLUE SHIELD | Admitting: Internal Medicine

## 2015-07-20 ENCOUNTER — Encounter: Payer: BLUE CROSS/BLUE SHIELD | Admitting: Internal Medicine

## 2015-08-26 ENCOUNTER — Ambulatory Visit (INDEPENDENT_AMBULATORY_CARE_PROVIDER_SITE_OTHER): Payer: BLUE CROSS/BLUE SHIELD | Admitting: Internal Medicine

## 2015-08-26 ENCOUNTER — Other Ambulatory Visit (HOSPITAL_COMMUNITY)
Admission: RE | Admit: 2015-08-26 | Discharge: 2015-08-26 | Disposition: A | Discharge status: Home / Self Care | Payer: BLUE CROSS/BLUE SHIELD | Source: Ambulatory Visit | Attending: Internal Medicine | Admitting: Internal Medicine

## 2015-08-26 ENCOUNTER — Encounter: Payer: Self-pay | Admitting: Internal Medicine

## 2015-08-26 VITALS — BP 100/60 | HR 88 | Temp 98.1°F | Resp 20 | Ht 66.0 in | Wt 220.0 lb

## 2015-08-26 DIAGNOSIS — E559 Vitamin D deficiency, unspecified: Secondary | ICD-10-CM

## 2015-08-26 DIAGNOSIS — Z01419 Encounter for gynecological examination (general) (routine) without abnormal findings: Secondary | ICD-10-CM | POA: Diagnosis not present

## 2015-08-26 DIAGNOSIS — Z124 Encounter for screening for malignant neoplasm of cervix: Secondary | ICD-10-CM

## 2015-08-26 DIAGNOSIS — Z8669 Personal history of other diseases of the nervous system and sense organs: Secondary | ICD-10-CM | POA: Diagnosis not present

## 2015-08-26 DIAGNOSIS — Z0000 Encounter for general adult medical examination without abnormal findings: Secondary | ICD-10-CM | POA: Diagnosis not present

## 2015-08-26 DIAGNOSIS — Z23 Encounter for immunization: Secondary | ICD-10-CM | POA: Diagnosis not present

## 2015-08-26 DIAGNOSIS — M5431 Sciatica, right side: Secondary | ICD-10-CM

## 2015-08-26 LAB — POCT URINALYSIS DIPSTICK
BILIRUBIN UA: NEGATIVE
Glucose, UA: NEGATIVE
Ketones, UA: NEGATIVE
Leukocytes, UA: NEGATIVE
NITRITE UA: NEGATIVE
PH UA: 6
PROTEIN UA: NEGATIVE
RBC UA: NEGATIVE
Spec Grav, UA: 1.02
Urobilinogen, UA: 0.2

## 2015-08-26 MED ORDER — HYDROCODONE-ACETAMINOPHEN 5-325 MG PO TABS: 1.0000 | ORAL_TABLET | Freq: Four times a day (QID) | ORAL | Status: DC | PRN

## 2015-08-26 MED ORDER — BUTALBITAL-APAP-CAFF-COD 50-325-40-30 MG PO CAPS: 1.0000 | ORAL_CAPSULE | Freq: Four times a day (QID) | ORAL | Status: DC | PRN

## 2015-08-26 MED ORDER — PREDNISONE 10 MG (21) PO TBPK: 10.0000 mg | ORAL_TABLET | Freq: Every day | ORAL | Status: DC

## 2015-08-26 MED ORDER — CYCLOBENZAPRINE HCL 10 MG PO TABS: 10.0000 mg | ORAL_TABLET | Freq: Three times a day (TID) | ORAL | Status: DC | PRN

## 2015-08-31 LAB — CYTOLOGY - PAP

## 2015-09-04 NOTE — Patient Instructions (Addendum)
Take prednisone as directed in tapering course over 6 days. Flexeril at bedtime. Norco 5/325 is needed for back pain and sciatica. May need physical therapy. Okay to refill Fioricet if needed for migraine headaches. Flu vaccine given. Take 2000 units vitamin D 3 daily.

## 2015-09-04 NOTE — Progress Notes (Signed)
Subjective:    Patient ID: Andrea Wagner, female    DOB: 1971-04-02, 44 y.o.   MRN: [id number]  HPI Pleasant 44 year old Black Female in today for health maintenance exam and evaluation of medical issues. 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 Has tried switching to left foot but still uses right foot more than left. Issues been going on now for several months because she's had returned to clinical activities because medical office where she is employed has been shorthanded. Has pain in right buttock radiating down right leg.  She has a history of migraine headaches. Usually can take Excedrin migraine for relief but sometimes takes Fioricet. History of vitamin D deficiency.  Past medical history: She had tubal ligation 1997. Has seen Dr. Thurston HoleWainer for knee pain in the past and was diagnosed with chondromalacia patella and varicose veins.  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 One son and one daughter. Son has Crohn's disease. Patient does not smoke or consume alcohol. 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  Family history: Father with history of MI, hypertension, diabetes. Mother with history of arthritis and hypertension.      Review of Systems  Respiratory: Negative.   Cardiovascular: Negative.   Gastrointestinal: Negative.   Musculoskeletal: Positive for back pain.       Sciatica  Neurological:       History of migraine headaches  All other systems reviewed and are negative.      Objective:   Physical Exam  Constitutional: She is oriented to person, place, and time. She appears well-developed and well-nourished. No distress.  HENT:  Head: Normocephalic and atraumatic.  Right Ear: External ear normal.  Left Ear: External ear normal.  Mouth/Throat: Oropharynx is clear and moist. No oropharyngeal exudate.  Eyes: Conjunctivae and EOM are  normal. Pupils are equal, round, and reactive to light. Right eye exhibits no discharge. Left eye exhibits no discharge. No scleral icterus.  Neck: Neck supple. No JVD present. No thyromegaly present.  Cardiovascular: Normal rate, regular rhythm, normal heart sounds and intact distal pulses.   No murmur heard. Breasts normal female  Pulmonary/Chest: Effort normal and breath sounds normal. No respiratory distress. She has no wheezes. She has no rales.  Abdominal: Soft. Bowel sounds are normal. She exhibits no distension and no mass. There is no tenderness. There is no rebound and no guarding.  Genitourinary:  Pap taken. Bimanual normal.  Musculoskeletal: She exhibits no edema.  Straight leg raising is negative at 90. Muscle strength is normal in lower extremities and deep tendon reflexes in the knees 2+ and symmetrical.  Lymphadenopathy:    She has no cervical adenopathy.  Neurological: She is alert and oriented to person, place, and time. She has normal reflexes. No cranial nerve deficit. Coordination normal.  Skin: Skin is warm and dry. No rash noted. She is not diaphoretic.  Psychiatric: She has a normal mood and affect. Her behavior is normal. Judgment and thought content normal.  Vitals reviewed.         Assessment & Plan:  Right sciatica  History of migraine headaches  History of vitamin D deficiency-level is 24 take 2000 units vitamin D 3 daily  Plan: Okay to refill Fioricet for migraine headaches. Sterapred DS 10 mg 6 day dosepak. Flexeril 10 mg at bedtime for sciatica. Norco 5/325 is needed for back pain and sciatica. May need  physical therapy if symptoms do not improve. Return in one year or as needed.

## 2015-09-16 ENCOUNTER — Other Ambulatory Visit: Payer: Self-pay

## 2015-09-16 DIAGNOSIS — Z1231 Encounter for screening mammogram for malignant neoplasm of breast: Secondary | ICD-10-CM

## 2015-09-27 ENCOUNTER — Other Ambulatory Visit: Payer: Self-pay

## 2015-09-27 MED ORDER — METRONIDAZOLE 500 MG PO TABS: ORAL_TABLET | ORAL | Status: DC

## 2015-09-30 ENCOUNTER — Ambulatory Visit
Admission: RE | Admit: 2015-09-30 | Discharge: 2015-09-30 | Disposition: A | Discharge status: Home / Self Care | Payer: BLUE CROSS/BLUE SHIELD | Source: Ambulatory Visit

## 2015-09-30 DIAGNOSIS — Z1231 Encounter for screening mammogram for malignant neoplasm of breast: Secondary | ICD-10-CM

## 2016-07-07 ENCOUNTER — Encounter: Payer: Self-pay | Admitting: Internal Medicine

## 2016-07-07 ENCOUNTER — Ambulatory Visit (INDEPENDENT_AMBULATORY_CARE_PROVIDER_SITE_OTHER): Payer: BLUE CROSS/BLUE SHIELD | Admitting: Internal Medicine

## 2016-07-07 VITALS — BP 120/80 | HR 84 | Temp 97.9°F | Wt 217.0 lb

## 2016-07-07 DIAGNOSIS — Z8669 Personal history of other diseases of the nervous system and sense organs: Secondary | ICD-10-CM

## 2016-07-07 MED ORDER — BUTALBITAL-APAP-CAFF-COD 50-325-40-30 MG PO CAPS: 1.0000 | ORAL_CAPSULE | Freq: Four times a day (QID) | ORAL | 1 refills | Status: DC | PRN

## 2016-07-07 NOTE — Patient Instructions (Signed)
Refilled Fioricet by written prescription. Has physical exam January 2018

## 2016-07-07 NOTE — Progress Notes (Signed)
   Subjective:    Patient ID: Andrea Wagner, female    DOB: Oct 10, 1970, 45 y.o.   MRN: [id number]  HPI   45 year old Female with history of migraine headaches. More migraines recently. 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 Spent 15 minutes speaking with her about these issues. She can take Excedrin and Fioricet and work with migraine but not Imitrex. It makes her too drowsy. She only takes Imitrex if no relief with these other medications.      Assessment & Plan:  History of migraine headaches  Plan: Refill Fioricet No. 60 one or 2 by mouth every 4-6 hours when necessary headache not to see 6 tablets in 24 hours. Keep appointment for physical examination January.

## 2016-08-31 ENCOUNTER — Other Ambulatory Visit: Payer: BLUE CROSS/BLUE SHIELD | Admitting: Internal Medicine

## 2016-08-31 ENCOUNTER — Ambulatory Visit (INDEPENDENT_AMBULATORY_CARE_PROVIDER_SITE_OTHER): Payer: BLUE CROSS/BLUE SHIELD | Admitting: Internal Medicine

## 2016-08-31 DIAGNOSIS — Z130 Encounter for screening for diseases of the blood and blood-forming organs and certain disorders involving the immune mechanism: Secondary | ICD-10-CM

## 2016-08-31 DIAGNOSIS — Z13220 Encounter for screening for lipoid disorders: Secondary | ICD-10-CM

## 2016-08-31 DIAGNOSIS — Z0000 Encounter for general adult medical examination without abnormal findings: Secondary | ICD-10-CM

## 2016-08-31 DIAGNOSIS — Z23 Encounter for immunization: Secondary | ICD-10-CM | POA: Diagnosis not present

## 2016-08-31 DIAGNOSIS — Z8669 Personal history of other diseases of the nervous system and sense organs: Secondary | ICD-10-CM

## 2016-08-31 DIAGNOSIS — E559 Vitamin D deficiency, unspecified: Secondary | ICD-10-CM

## 2016-08-31 LAB — COMPLETE METABOLIC PANEL WITH GFR
ALBUMIN: 3.9 g/dL (ref 3.6–5.1)
ALK PHOS: 58 U/L (ref 33–115)
ALT: 11 U/L (ref 6–29)
AST: 13 U/L (ref 10–35)
BILIRUBIN TOTAL: 0.4 mg/dL (ref 0.2–1.2)
BUN: 11 mg/dL (ref 7–25)
CALCIUM: 9.3 mg/dL (ref 8.6–10.2)
CO2: 26 mmol/L (ref 20–31)
CREATININE: 0.82 mg/dL (ref 0.50–1.10)
Chloride: 106 mmol/L (ref 98–110)
GFR, Est African American: >89 mL/min (ref >=60)
GFR, Est Non African American: 87 mL/min (ref >=60)
GLUCOSE: 89 mg/dL (ref 65–99)
Potassium: 4.4 mmol/L (ref 3.5–5.3)
SODIUM: 140 mmol/L (ref 135–146)
TOTAL PROTEIN: 6.4 g/dL (ref 6.1–8.1)

## 2016-08-31 LAB — CBC WITH DIFFERENTIAL/PLATELET
BASOS PCT: 0 %
Basophils Absolute: 0 cells/uL (ref 0–200)
EOS ABS: 83 {cells}/uL (ref 15–500)
EOS PCT: 1 %
HCT: 38.5 % (ref 35.0–45.0)
Hemoglobin: 12.5 g/dL (ref 11.7–15.5)
Lymphocytes Relative: 19 %
Lymphs Abs: 1577 cells/uL (ref 850–3900)
MCH: 26.5 pg — ABNORMAL LOW (ref 27.0–33.0)
MCHC: 32.5 g/dL (ref 32.0–36.0)
MCV: 81.6 fL (ref 80.0–100.0)
MONOS PCT: 9 %
MPV: 11.2 fL (ref 7.5–12.5)
Monocytes Absolute: 747 cells/uL (ref 200–950)
NEUTROS ABS: 5893 {cells}/uL (ref 1500–7800)
Neutrophils Relative %: 71 %
PLATELETS: 246 10*3/uL (ref 140–400)
RBC: 4.72 MIL/uL (ref 3.80–5.10)
RDW: 14.6 % (ref 11.0–15.0)
WBC: 8.3 10*3/uL (ref 3.8–10.8)

## 2016-08-31 LAB — LIPID PANEL
CHOLESTEROL: 177 mg/dL (ref <200)
HDL: 68 mg/dL (ref >50)
LDL Cholesterol: 84 mg/dL (ref <100)
Total CHOL/HDL Ratio: 2.6 Ratio (ref <5.0)
Triglycerides: 127 mg/dL (ref <150)
VLDL: 25 mg/dL (ref <30)

## 2016-08-31 LAB — TSH: TSH: 0.59 m[IU]/L

## 2016-08-31 NOTE — Progress Notes (Signed)
Flu vaccine given.

## 2016-09-01 LAB — VITAMIN D 25 HYDROXY (VIT D DEFICIENCY, FRACTURES): VIT D 25 HYDROXY: 28 ng/mL — AB (ref 30–100)

## 2016-09-07 ENCOUNTER — Ambulatory Visit (INDEPENDENT_AMBULATORY_CARE_PROVIDER_SITE_OTHER): Payer: BLUE CROSS/BLUE SHIELD | Admitting: Internal Medicine

## 2016-09-07 ENCOUNTER — Encounter: Payer: Self-pay | Admitting: Internal Medicine

## 2016-09-07 VITALS — BP 112/80 | HR 81 | Temp 97.6°F | Resp 18 | Ht 65.0 in | Wt 224.0 lb

## 2016-09-07 DIAGNOSIS — Z0000 Encounter for general adult medical examination without abnormal findings: Secondary | ICD-10-CM | POA: Diagnosis not present

## 2016-09-07 DIAGNOSIS — E559 Vitamin D deficiency, unspecified: Secondary | ICD-10-CM | POA: Diagnosis not present

## 2016-09-07 DIAGNOSIS — R319 Hematuria, unspecified: Secondary | ICD-10-CM

## 2016-09-07 DIAGNOSIS — Z8669 Personal history of other diseases of the nervous system and sense organs: Secondary | ICD-10-CM

## 2016-09-07 LAB — POCT URINALYSIS DIPSTICK
Bilirubin, UA: NEGATIVE
GLUCOSE UA: NEGATIVE
KETONES UA: NEGATIVE
Nitrite, UA: NEGATIVE
SPEC GRAV UA: 1.025
UROBILINOGEN UA: NEGATIVE
pH, UA: 6

## 2016-09-07 MED ORDER — IBUPROFEN 800 MG PO TABS: 800.0000 mg | ORAL_TABLET | Freq: Three times a day (TID) | ORAL | 3 refills | Status: DC | PRN

## 2016-09-07 MED ORDER — HYDROCODONE-ACETAMINOPHEN 5-325 MG PO TABS: 1.0000 | ORAL_TABLET | Freq: Four times a day (QID) | ORAL | 0 refills | Status: DC | PRN

## 2016-09-07 NOTE — Progress Notes (Signed)
   Subjective:    Patient ID: Andrea Wagner, female    DOB: [birth year], 46 y.o.   MRN: [id number]  HPI [age] year old Female in today for health maintenance exam and evaluation of medical issues. She has a history of migraine headaches. History of sciatica. Hydrocodone/APAP refilled today should she have a flare of sciatica. She takes this sparingly and is responsible with it. Needs refill on ibuprofen.  Sometimes has some cramping in her left leg. Seems to be musculoskeletal pain.  She took flu vaccine recently.  Social history: She is married. She works for [employer]. 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 She has about 2 more years left in school. Does not smoke.  Review of Systems no UTI symptoms     Objective:   Physical Exam  Constitutional: She is oriented to person, place, and time. She appears well-developed and well-nourished. No distress.  HENT:  Head: Normocephalic and atraumatic.  Right Ear: External ear normal.  Left Ear: External ear normal.  Mouth/Throat: Oropharynx is clear and moist.  Eyes: Conjunctivae and EOM are normal. Pupils are equal, round, and reactive to light. Right eye exhibits no discharge. Left eye exhibits no discharge. No scleral icterus.  Neck: Neck supple. No JVD present. No thyromegaly present.  Cardiovascular: Normal rate and regular rhythm.   No murmur heard. Breasts normal female with fibrocystic changes particularly left upper outer quadrant  Pulmonary/Chest: Effort normal and breath sounds normal. No respiratory distress. She has no wheezes. She has no rales. She exhibits no tenderness.  Abdominal: Soft. Bowel sounds are normal. She exhibits no distension and no mass. There is no tenderness. There is no rebound and no guarding.  Genitourinary:  Genitourinary Comments: Had pap last year. Bimanual normal. Has anal tag at 7:00 position  Musculoskeletal: She exhibits no edema.  Neurological: She is alert and oriented to person,  place, and time. She has normal reflexes. No cranial nerve deficit. Coordination normal.  Skin: Skin is warm and dry. No rash noted.  Psychiatric: She has a normal mood and affect. Her behavior is normal. Judgment and thought content normal.          Assessment & Plan:  Refill Norco 5/325 for sciatica-History of recurrent sciatica  Migraine headaches  Anal tag  Abnormal UA-asymptomatic. Culture pending.  Plan: Return in one year or as needed.

## 2016-09-07 NOTE — Patient Instructions (Addendum)
Urine will be cultured because of abnormal dipstick results. Hydrocodone/APAP refilled for history of recurrent sciatica. Ibuprofen refilled. Return in one year or as needed. It was a pleasure to see you today. Have annual mammogram.

## 2016-09-09 LAB — URINE CULTURE

## 2016-09-11 ENCOUNTER — Telehealth: Payer: Self-pay | Admitting: Internal Medicine

## 2016-09-11 MED ORDER — NITROFURANTOIN MONOHYD MACRO 100 MG PO CAPS: 100.0000 mg | ORAL_CAPSULE | Freq: Two times a day (BID) | ORAL | 0 refills | Status: DC

## 2016-09-11 NOTE — Telephone Encounter (Signed)
Macrobid sent to pharmacy.  Left message informing patient.  Follow up appointment made.

## 2016-09-11 NOTE — Telephone Encounter (Signed)
-----   Message from Margaree MackintoshMary J Baxley, MD sent at 09/11/2016  9:52 AM EST ----- Has multidrug resistant UTI. Please call in Macrobid 100 mg twice daily for 10 days with plans to repeat urinalysis after that in approximately 2 weeks here at the office. That will be a nurse visit only.

## 2016-09-25 ENCOUNTER — Ambulatory Visit (INDEPENDENT_AMBULATORY_CARE_PROVIDER_SITE_OTHER): Payer: BLUE CROSS/BLUE SHIELD | Admitting: Internal Medicine

## 2016-09-25 ENCOUNTER — Encounter: Payer: Self-pay | Admitting: Internal Medicine

## 2016-09-25 VITALS — BP 110/78 | HR 97 | Temp 98.4°F | Wt 229.0 lb

## 2016-09-25 DIAGNOSIS — R8290 Unspecified abnormal findings in urine: Secondary | ICD-10-CM

## 2016-09-25 DIAGNOSIS — R809 Proteinuria, unspecified: Secondary | ICD-10-CM

## 2016-09-25 DIAGNOSIS — Z87440 Personal history of urinary (tract) infections: Secondary | ICD-10-CM | POA: Diagnosis not present

## 2016-09-25 LAB — POC URINALSYSI DIPSTICK (AUTOMATED)
Bilirubin, UA: NEGATIVE
GLUCOSE UA: NEGATIVE
Ketones, UA: NEGATIVE
LEUKOCYTES UA: NEGATIVE
NITRITE UA: NEGATIVE
PH UA: 6.5
RBC UA: NEGATIVE
SPEC GRAV UA: 1.015
UROBILINOGEN UA: NEGATIVE

## 2016-09-25 NOTE — Progress Notes (Signed)
For follow-up of urinary tract infection that was detected on physical exam 09/07/2016. Urine culture grew greater than 100,000 colonies per milliliter Escherichia coli. She had multidrug resistant UTI. She was placed on Macrobid 100 mg twice daily for 10 days.  Urine dipstick today is essentially within normal limits. UTI resolved.

## 2016-09-25 NOTE — Patient Instructions (Signed)
UTI resolved.

## 2016-09-26 LAB — URINE CULTURE

## 2016-10-26 ENCOUNTER — Other Ambulatory Visit: Payer: Self-pay | Admitting: Internal Medicine

## 2016-10-26 DIAGNOSIS — Z1231 Encounter for screening mammogram for malignant neoplasm of breast: Secondary | ICD-10-CM

## 2016-10-30 ENCOUNTER — Ambulatory Visit
Admission: RE | Admit: 2016-10-30 | Discharge: 2016-10-30 | Disposition: A | Discharge status: Home / Self Care | Payer: BLUE CROSS/BLUE SHIELD | Source: Ambulatory Visit | Attending: Internal Medicine | Admitting: Internal Medicine

## 2016-10-30 DIAGNOSIS — Z1231 Encounter for screening mammogram for malignant neoplasm of breast: Secondary | ICD-10-CM

## 2016-11-20 ENCOUNTER — Ambulatory Visit: Payer: Self-pay | Admitting: Internal Medicine

## 2016-11-21 ENCOUNTER — Ambulatory Visit: Payer: BLUE CROSS/BLUE SHIELD | Admitting: Internal Medicine

## 2016-11-23 ENCOUNTER — Other Ambulatory Visit: Payer: Self-pay | Admitting: Internal Medicine

## 2016-11-23 ENCOUNTER — Encounter: Payer: Self-pay | Admitting: Internal Medicine

## 2016-11-23 ENCOUNTER — Ambulatory Visit (INDEPENDENT_AMBULATORY_CARE_PROVIDER_SITE_OTHER): Payer: BLUE CROSS/BLUE SHIELD | Admitting: Internal Medicine

## 2016-11-23 VITALS — BP 110/80 | HR 80 | Temp 98.6°F | Ht 66.0 in | Wt 222.0 lb

## 2016-11-23 DIAGNOSIS — Z9189 Other specified personal risk factors, not elsewhere classified: Secondary | ICD-10-CM

## 2016-11-23 DIAGNOSIS — Z0184 Encounter for antibody response examination: Secondary | ICD-10-CM | POA: Diagnosis not present

## 2016-11-23 DIAGNOSIS — Z23 Encounter for immunization: Secondary | ICD-10-CM

## 2016-11-23 NOTE — Patient Instructions (Signed)
Form completed for school. Titers are pending.

## 2016-11-23 NOTE — Progress Notes (Signed)
Orders entered and labs obtained

## 2016-11-23 NOTE — Progress Notes (Signed)
   Subjective:    Patient ID: Andrea Wagner, female    DOB: [birth year], 46 y.o.   MRN: [id number]  HPI Patient is enrolled in nurse practitioner school. She needs titers drawn for school. She brings in immunization record. She also needs QuantiFERON TB testing.  She has a form that needs to be completed regarding her health. She recently had physical exam here 09/07/2016. Labs were within normal limits at the time.  She has a history of migraine headaches. No history of serious illnesses accidents or major operations. She has had a tubal ligation.    Review of Systems     Objective:   Physical Exam  Not examined spent 15 minutes completing form. Hearing checked and is normal in each year at 40 dB      Assessment & Plan:  School form completion  Titers drawn as requested

## 2016-11-24 LAB — HEPATITIS B SURFACE ANTIBODY,QUALITATIVE: Hep B S Ab: POSITIVE — AB

## 2016-11-24 LAB — MEASLES/MUMPS/RUBELLA IMMUNITY
MUMPS IGG: 78.6 [AU]/ml — AB (ref <9.00)
Rubella: 12.1 Index — ABNORMAL HIGH (ref <0.90)
Rubeola IgG: 139 AU/mL — ABNORMAL HIGH (ref <25.00)

## 2016-11-27 ENCOUNTER — Emergency Department (HOSPITAL_COMMUNITY)
Admission: EM | Admit: 2016-11-27 | Discharge: 2016-11-27 | Disposition: A | Discharge status: Home / Self Care | Payer: BLUE CROSS/BLUE SHIELD | Attending: Emergency Medicine | Admitting: Emergency Medicine

## 2016-11-27 ENCOUNTER — Encounter (HOSPITAL_COMMUNITY): Payer: Self-pay | Admitting: Emergency Medicine

## 2016-11-27 DIAGNOSIS — Z79899 Other long term (current) drug therapy: Secondary | ICD-10-CM | POA: Insufficient documentation

## 2016-11-27 DIAGNOSIS — M545 Low back pain, unspecified: Secondary | ICD-10-CM

## 2016-11-27 DIAGNOSIS — N3000 Acute cystitis without hematuria: Secondary | ICD-10-CM | POA: Insufficient documentation

## 2016-11-27 DIAGNOSIS — R109 Unspecified abdominal pain: Secondary | ICD-10-CM | POA: Diagnosis present

## 2016-11-27 DIAGNOSIS — Z7982 Long term (current) use of aspirin: Secondary | ICD-10-CM | POA: Diagnosis not present

## 2016-11-27 HISTORY — DX: Migraine, unspecified, not intractable, without status migrainosus: G43.909

## 2016-11-27 HISTORY — DX: Tubulo-interstitial nephritis, not specified as acute or chronic: N12

## 2016-11-27 LAB — COMPREHENSIVE METABOLIC PANEL
ALBUMIN: 3.7 g/dL (ref 3.5–5.0)
ALT: 15 U/L (ref 14–54)
ANION GAP: 11 (ref 5–15)
AST: 18 U/L (ref 15–41)
Alkaline Phosphatase: 83 U/L (ref 38–126)
BILIRUBIN TOTAL: 0.6 mg/dL (ref 0.3–1.2)
BUN: 13 mg/dL (ref 6–20)
CHLORIDE: 104 mmol/L (ref 101–111)
CO2: 24 mmol/L (ref 22–32)
Calcium: 8.9 mg/dL (ref 8.9–10.3)
Creatinine, Ser: 1 mg/dL (ref 0.44–1.00)
GFR calc Af Amer: >60 mL/min (ref >60)
Glucose, Bld: 116 mg/dL — ABNORMAL HIGH (ref 65–99)
POTASSIUM: 3.7 mmol/L (ref 3.5–5.1)
Sodium: 139 mmol/L (ref 135–145)
Total Protein: 6.6 g/dL (ref 6.5–8.1)

## 2016-11-27 LAB — URINALYSIS, ROUTINE W REFLEX MICROSCOPIC
BILIRUBIN URINE: NEGATIVE
Glucose, UA: NEGATIVE mg/dL
KETONES UR: NEGATIVE mg/dL
Nitrite: NEGATIVE
PROTEIN: NEGATIVE mg/dL
Specific Gravity, Urine: 1.011 (ref 1.005–1.030)
pH: 5 (ref 5.0–8.0)

## 2016-11-27 LAB — CBC
HEMATOCRIT: 37.1 % (ref 36.0–46.0)
HEMOGLOBIN: 12.3 g/dL (ref 12.0–15.0)
MCH: 26.5 pg (ref 26.0–34.0)
MCHC: 33.2 g/dL (ref 30.0–36.0)
MCV: 80 fL (ref 78.0–100.0)
PLATELETS: 264 10*3/uL (ref 150–400)
RBC: 4.64 MIL/uL (ref 3.87–5.11)
RDW: 13.4 % (ref 11.5–15.5)
WBC: 12.1 10*3/uL — ABNORMAL HIGH (ref 4.0–10.5)

## 2016-11-27 LAB — PREGNANCY, URINE: PREG TEST UR: NEGATIVE

## 2016-11-27 LAB — LIPASE, BLOOD: Lipase: 31 U/L (ref 11–51)

## 2016-11-27 LAB — QUANTIFERON TB GOLD ASSAY (BLOOD)
Interferon Gamma Release Assay: NEGATIVE
Mitogen-Nil: 3.04 IU/mL
Quantiferon Nil Value: 0.03 IU/mL
Quantiferon Tb Ag Minus Nil Value: <0 IU/mL

## 2016-11-27 MED ORDER — HYDROCODONE-ACETAMINOPHEN 5-325 MG PO TABS
1.0000 | ORAL_TABLET | Freq: Once | ORAL | Status: AC
  Administered 2016-11-27: 1 via ORAL
  Filled 2016-11-27: qty 1

## 2016-11-27 MED ORDER — CEPHALEXIN 500 MG PO CAPS: 500.0000 mg | ORAL_CAPSULE | Freq: Four times a day (QID) | ORAL | 0 refills | Status: DC

## 2016-11-27 MED ORDER — ONDANSETRON 4 MG PO TBDP
8.0000 mg | ORAL_TABLET | Freq: Once | ORAL | Status: AC
  Administered 2016-11-27: 8 mg via ORAL
  Filled 2016-11-27: qty 2

## 2016-11-27 NOTE — ED Triage Notes (Addendum)
Pt reports that she woke up this morning with low back pain and lower abdominal pain with chills. Pt has history of same and was told she had pyelonephritis. Pt reports that she had the TDAP on Thursday and also that her menstrual cycle this month lasted 2 weeks.

## 2016-11-27 NOTE — ED Provider Notes (Signed)
[facility]-EMERGENCY DEPT Provider Note   CSN: [id number] Arrival date & time: 11/27/16  0341     History   Chief Complaint Chief Complaint  Patient presents with  . Back Pain  . Chills  . Abdominal Pain    HPI Andrea Wagner is a 46 y.o. female.  The history is provided by the patient.  Back Pain   This is a new problem. The current episode started yesterday. The problem occurs constantly. The problem has been gradually worsening. The pain is associated with no known injury. Quality: pressure. The pain does not radiate. The pain is moderate. The pain is the same all the time. Associated symptoms include abdominal pain. Pertinent negatives include no fever, no bowel incontinence and no bladder incontinence. Associated symptoms comments: "pressure with urination" .  Abdominal Pain   Pertinent negatives include fever.  patient with h/o pyelonephritis who presents with back pain/pressure and urinary pressure She reports chills but no recorded fever No vomiting/diarrhea No vaginal bleeding/discharge noted at this time No new leg weakness No urinary/fecal incontinence reported No h/o back/abdominal surgery  She reports all of her symptoms are similar to prior episode of pyelonephritis   Past Medical History:  Diagnosis Date  . Migraine   . Pyelonephritis     Patient Active Problem List   Diagnosis Date Noted  . 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 Plantar fasciitis, left 03/29/2014  . Bilateral knee pain 06/09/2013  . History of migraine headaches 03/05/2012  . Vitamin D deficiency 03/05/2012    Past Surgical History:  Procedure Laterality Date  . TUBAL LIGATION      OB History    No data available       Home Medications    Prior to Admission medications   Medication Sig Start Date End Date Taking? Authorizing Provider  aspirin-acetaminophen-caffeine (EXCEDRIN MIGRAINE) [phone] MG per tablet Take 1 tablet by mouth every 6 (six) hours as needed  (migraine).    Yes Historical Provider, MD  butalbital-acetaminophen-caffeine (FIORICET WITH CODEINE) 50-325-40-30 MG capsule Take 1 capsule by mouth every 6 (six) hours as needed for headache. 07/07/16  Yes Margaree Mackintosh, MD  ibuprofen (ADVIL,MOTRIN) 800 MG tablet Take 1 tablet (800 mg total) by mouth every 8 (eight) hours as needed. 09/07/16  Yes Margaree Mackintosh, MD  naproxen sodium (ANAPROX) 220 MG tablet Take 220 mg by mouth 2 (two) times daily as needed (for pain).   Yes Historical Provider, MD  cephALEXin (KEFLEX) 500 MG capsule Take 1 capsule (500 mg total) by mouth 4 (four) times daily. 11/27/16   Zadie Rhine, MD  ergocalciferol (VITAMIN D2) 50000 UNITS capsule Take 1 capsule (50,000 Units total) by mouth once a week. Patient not taking: Reported on 11/27/2016 08/24/14   Margaree Mackintosh, MD  HYDROcodone-acetaminophen [facility]) 5-325 MG tablet Take 1 tablet by mouth every 6 (six) hours as needed for moderate pain. Patient not taking: Reported on 11/27/2016 09/07/16   Margaree Mackintosh, MD    Family History Family History  Problem Relation Age of Onset  . Hypertension Mother   . Diabetes Father   . Heart disease Father     Social History Social History  Substance Use Topics  . Smoking status: Never Smoker  . Smokeless tobacco: Never Used  . Alcohol use Yes     Comment: rarely     Allergies   Sulfa antibiotics   Review of Systems Review of Systems  Constitutional: Positive for chills. Negative for fever.  Gastrointestinal: Positive for abdominal pain. Negative for bowel incontinence.  Genitourinary: Negative for bladder incontinence, vaginal bleeding and vaginal discharge.  Musculoskeletal: Positive for back pain.  All other systems reviewed and are negative.    Physical Exam Updated Vital Signs BP 113/72   Pulse 77   Temp 98.4 F (36.9 C) (Oral)   Resp 18   Ht 5\' 6"  (1.676 m)   Wt 100.7 kg   LMP 11/21/2016   SpO2 96%   BMI 35.83 kg/m   Physical Exam CONSTITUTIONAL:  Well developed/well nourished HEAD: Normocephalic/atraumatic EYES: EOMI/PERRL ENMT: Mucous membranes moist NECK: supple no meningeal signs SPINE/BACK:entire spine nontender, No bruising/crepitance/stepoffs noted to spine CV: S1/S2 noted, no murmurs/rubs/gallops noted LUNGS: Lungs are clear to auscultation bilaterally, no apparent distress ABDOMEN: soft, nontender, no rebound or guarding GU:no cva tenderness NEURO: Awake/alert, equal motor 5/5 strength noted with the following: hip flexion/knee flexion/extension, foot dorsi/plantar flexion, no sensory deficit in any dermatome.    EXTREMITIES: pulses normal, full ROM SKIN: warm, color normal PSYCH: no abnormalities of mood noted, alert and oriented to situation  ED Treatments / Results  Labs (all labs ordered are listed, but only abnormal results are displayed) Labs Reviewed  COMPREHENSIVE METABOLIC PANEL - Abnormal; Notable for the following:       Result Value   Glucose, Bld 116 (*)    All other components within normal limits  CBC - Abnormal; Notable for the following:    WBC 12.1 (*)    All other components within normal limits  URINALYSIS, ROUTINE W REFLEX MICROSCOPIC - Abnormal; Notable for the following:    Color, Urine STRAW (*)    Hgb urine dipstick SMALL (*)    Leukocytes, UA SMALL (*)    Bacteria, UA RARE (*)    Squamous Epithelial / LPF 0-5 (*)    All other components within normal limits  LIPASE, BLOOD  PREGNANCY, URINE    EKG  EKG Interpretation None       Radiology No results found.  Procedures Procedures (including critical care time)  Medications Ordered in ED Medications  ondansetron (ZOFRAN-ODT) disintegrating tablet 8 mg (8 mg Oral Given 11/27/16 0602)  HYDROcodone-acetaminophen (NORCO/VICODIN) 5-325 MG per tablet 1 tablet (1 tablet Oral Given 11/27/16 [id number])     Initial Impression / Assessment and Plan / ED Course  I have reviewed the triage vital signs and the nursing notes.  Pertinent labs   results that were available during my care of the patient were reviewed by me and considered in my medical decision making (see chart for details).     Pt well appearing She reports symptoms closely resemble previous PYELO She responded to pain meds Her abdominal exam was unremarkable She had no focal weakness to suggest spinal myelopathy Due to history/symptoms will start on antibiotics I don't imaging required at this time Pt is agreeable with this plan Will d/c home We discussed return precautions   Final Clinical Impressions(s) / ED Diagnoses   Final diagnoses:  Acute bilateral low back pain without sciatica  Acute cystitis without hematuria    New Prescriptions Discharge Medication List as of 11/27/2016  7:07 AM    START taking these medications   Details  cephALEXin (KEFLEX) 500 MG capsule Take 1 capsule (500 mg total) by mouth 4 (four) times daily., Starting Mon 11/27/2016, Print         Zadie Rhineonald Divinity Kyler, MD 11/27/16 [phone]

## 2016-12-07 ENCOUNTER — Encounter: Payer: Self-pay | Admitting: Internal Medicine

## 2016-12-07 ENCOUNTER — Ambulatory Visit (INDEPENDENT_AMBULATORY_CARE_PROVIDER_SITE_OTHER): Payer: BLUE CROSS/BLUE SHIELD | Admitting: Internal Medicine

## 2016-12-07 VITALS — BP 120/80 | HR 96 | Temp 98.6°F | Ht 66.0 in | Wt 224.3 lb

## 2016-12-07 DIAGNOSIS — B373 Candidiasis of vulva and vagina: Secondary | ICD-10-CM | POA: Diagnosis not present

## 2016-12-07 DIAGNOSIS — R3915 Urgency of urination: Secondary | ICD-10-CM

## 2016-12-07 DIAGNOSIS — Z87440 Personal history of urinary (tract) infections: Secondary | ICD-10-CM | POA: Diagnosis not present

## 2016-12-07 DIAGNOSIS — B3731 Acute candidiasis of vulva and vagina: Secondary | ICD-10-CM

## 2016-12-07 LAB — POCT URINALYSIS DIPSTICK
Bilirubin, UA: NEGATIVE
GLUCOSE UA: NEGATIVE
Ketones, UA: NEGATIVE
Leukocytes, UA: NEGATIVE
NITRITE UA: NEGATIVE
SPEC GRAV UA: 1.015 (ref 1.030–1.035)
UROBILINOGEN UA: 0.2 (ref ?–2.0)
pH, UA: 6 (ref 5.0–8.0)

## 2016-12-07 MED ORDER — FLUCONAZOLE 150 MG PO TABS
150.0000 mg | ORAL_TABLET | Freq: Once | ORAL | 1 refills | Status: AC
Start: 2016-12-07 — End: 2016-12-07

## 2016-12-07 NOTE — Progress Notes (Signed)
   Subjective:    Patient ID: Andrea Wagner, female    DOB: 01/21/1971, 46 y.o.   MRN: [id number]  HPI Seen in ED March 26.Says she awakened with back pain and chills. Felt like she had pyelonephritis. In the emergency department, urine specimen showed  small LE and small occult blood.  Had 6-30 WBCs on microscopic specimen. No culture was done apparently. She was treated with Keflex which she took for 7 days. Now has vaginal itching and white discharge. Still has a little bit of low back pain. No CVA tenderness. No nausea and vomiting.  Repeat urine specimen today shows trace occult blood. She also has some protein in her urine. Specific gravity 1.015. In January 2018 she only had a trace of protein in urine. Culture was sent.    Review of Systemsnon contributory     Objective:   Physical Exam She has thick cottage cheeselike white vaginal discharge consistent with Candida.       Assessment & Plan:  Candida vaginitis  Status post treatment for UTI. Culture pending.  Plan: Diflucan 150 mg tablet with 1 refill.

## 2016-12-07 NOTE — Patient Instructions (Signed)
Urine culture pending. Take Diflucan 150 mg tablet by mouth once and if not better in 3-4 days repeat dose.

## 2016-12-08 LAB — URINE CULTURE

## 2017-10-03 ENCOUNTER — Other Ambulatory Visit: Payer: Self-pay

## 2017-10-03 DIAGNOSIS — Z0000 Encounter for general adult medical examination without abnormal findings: Secondary | ICD-10-CM

## 2017-10-03 DIAGNOSIS — E559 Vitamin D deficiency, unspecified: Secondary | ICD-10-CM

## 2017-10-09 ENCOUNTER — Other Ambulatory Visit: Payer: Self-pay

## 2017-11-01 ENCOUNTER — Other Ambulatory Visit: Payer: BLUE CROSS/BLUE SHIELD | Admitting: Internal Medicine

## 2017-11-01 DIAGNOSIS — Z0000 Encounter for general adult medical examination without abnormal findings: Secondary | ICD-10-CM

## 2017-11-01 DIAGNOSIS — Z1329 Encounter for screening for other suspected endocrine disorder: Secondary | ICD-10-CM

## 2017-11-01 DIAGNOSIS — E559 Vitamin D deficiency, unspecified: Secondary | ICD-10-CM

## 2017-11-01 DIAGNOSIS — Z13220 Encounter for screening for lipoid disorders: Secondary | ICD-10-CM

## 2017-11-01 NOTE — Addendum Note (Signed)
Addended by: Gregery NaVALENCIA, Flavius Repsher P on: 11/01/2017 11:06 AM   Modules accepted: Orders

## 2017-11-02 LAB — CBC WITH DIFFERENTIAL/PLATELET
BASOS ABS: 38 {cells}/uL (ref 0–200)
Basophils Relative: 0.5 %
EOS ABS: 158 {cells}/uL (ref 15–500)
Eosinophils Relative: 2.1 %
HCT: 35.4 % (ref 35.0–45.0)
Hemoglobin: 11.7 g/dL (ref 11.7–15.5)
Lymphs Abs: 1508 cells/uL (ref 850–3900)
MCH: 26.5 pg — ABNORMAL LOW (ref 27.0–33.0)
MCHC: 33.1 g/dL (ref 32.0–36.0)
MCV: 80.1 fL (ref 80.0–100.0)
MONOS PCT: 9.9 %
MPV: 12.1 fL (ref 7.5–12.5)
NEUTROS PCT: 67.4 %
Neutro Abs: 5055 cells/uL (ref 1500–7800)
PLATELETS: 257 10*3/uL (ref 140–400)
RBC: 4.42 10*6/uL (ref 3.80–5.10)
RDW: 13.3 % (ref 11.0–15.0)
TOTAL LYMPHOCYTE: 20.1 %
WBC: 7.5 10*3/uL (ref 3.8–10.8)
WBCMIX: 743 {cells}/uL (ref 200–950)

## 2017-11-02 LAB — COMPLETE METABOLIC PANEL WITH GFR
AG RATIO: 1.6 (calc) (ref 1.0–2.5)
ALKALINE PHOSPHATASE (APISO): 64 U/L (ref 33–115)
ALT: 12 U/L (ref 6–29)
AST: 13 U/L (ref 10–35)
Albumin: 4 g/dL (ref 3.6–5.1)
BILIRUBIN TOTAL: 0.3 mg/dL (ref 0.2–1.2)
BUN: 12 mg/dL (ref 7–25)
CHLORIDE: 105 mmol/L (ref 98–110)
CO2: 28 mmol/L (ref 20–32)
Calcium: 9.5 mg/dL (ref 8.6–10.2)
Creat: 0.9 mg/dL (ref 0.50–1.10)
GFR, EST AFRICAN AMERICAN: 89 mL/min/{1.73_m2} (ref >=60)
GFR, Est Non African American: 77 mL/min/{1.73_m2} (ref >=60)
GLUCOSE: 90 mg/dL (ref 65–99)
Globulin: 2.5 g/dL (calc) (ref 1.9–3.7)
Potassium: 4.2 mmol/L (ref 3.5–5.3)
Sodium: 139 mmol/L (ref 135–146)
TOTAL PROTEIN: 6.5 g/dL (ref 6.1–8.1)

## 2017-11-02 LAB — LIPID PANEL
CHOLESTEROL: 167 mg/dL (ref <200)
HDL: 69 mg/dL (ref >50)
LDL Cholesterol (Calc): 82 mg/dL (calc)
Non-HDL Cholesterol (Calc): 98 mg/dL (calc) (ref <130)
TRIGLYCERIDES: 79 mg/dL (ref <150)
Total CHOL/HDL Ratio: 2.4 (calc) (ref <5.0)

## 2017-11-02 LAB — VITAMIN D 25 HYDROXY (VIT D DEFICIENCY, FRACTURES): Vit D, 25-Hydroxy: 34 ng/mL (ref 30–100)

## 2017-11-02 LAB — TSH: TSH: 0.81 m[IU]/L

## 2017-11-05 ENCOUNTER — Ambulatory Visit (INDEPENDENT_AMBULATORY_CARE_PROVIDER_SITE_OTHER): Payer: BLUE CROSS/BLUE SHIELD | Admitting: Internal Medicine

## 2017-11-05 ENCOUNTER — Encounter: Payer: Self-pay | Admitting: Internal Medicine

## 2017-11-05 VITALS — BP 110/80 | HR 88 | Ht 66.0 in | Wt 229.0 lb

## 2017-11-05 DIAGNOSIS — Z8669 Personal history of other diseases of the nervous system and sense organs: Secondary | ICD-10-CM | POA: Diagnosis not present

## 2017-11-05 DIAGNOSIS — Z0000 Encounter for general adult medical examination without abnormal findings: Secondary | ICD-10-CM

## 2017-11-05 LAB — POCT URINALYSIS DIPSTICK
Appearance: NORMAL
BILIRUBIN UA: NEGATIVE
Blood, UA: NEGATIVE
Glucose, UA: NEGATIVE
KETONES UA: NEGATIVE
Leukocytes, UA: NEGATIVE
NITRITE UA: NEGATIVE
ODOR: NORMAL
Protein, UA: NEGATIVE
SPEC GRAV UA: 1.015 (ref 1.010–1.025)
Urobilinogen, UA: 0.2 E.U./dL
pH, UA: 6.5 (ref 5.0–8.0)

## 2017-11-05 MED ORDER — BUTALBITAL-APAP-CAFF-COD 50-325-40-30 MG PO CAPS: 1.0000 | ORAL_CAPSULE | Freq: Four times a day (QID) | ORAL | 1 refills | Status: DC | PRN

## 2017-11-05 NOTE — Progress Notes (Signed)
   Subjective:    Patient ID: Andrea Wagner, female    DOB: 10-30-1970, 47 y.o.   MRN: [id number]  HPI 47 year old Female for health maintenance and evaluation of medical issues. Labs reviewed and WNL. Weight has increased 5 pounds. Does not eat until 10 am. Today ate 2 boiled eggs and small serving canteloupe. Ate 2 pieces of backed chicken with white rice, broccolli, Now drinking more water. Cutting back on sweet tea and sodas. Does not eat a lot of bread.  Hx migraine headaches maybe once a month.  Migraine medication refilled.   Still having periods sometimes passes clots.  Getting massage monthly.   FHx: Mother with history of hip replacement, hx Guillain -Barre syndrome affecting lower extremities and hands.  SHx: She is married.  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  Does not smoke.     Review of Systems  Respiratory: Negative.   Cardiovascular: Negative.   Gastrointestinal: Negative.   Neurological:       History of migraine headaches       Objective:   Physical Exam  Constitutional: She is oriented to person, place, and time. She appears well-developed and well-nourished. No distress.  HENT:  Head: Normocephalic and atraumatic.  Right Ear: External ear normal.  Left Ear: External ear normal.  Mouth/Throat: Oropharynx is clear and moist.  Eyes: Pupils are equal, round, and reactive to light. Conjunctivae and EOM are normal. Left eye exhibits no discharge. No scleral icterus.  Neck: Neck supple. No JVD present. No thyromegaly present.  Cardiovascular: Normal rate, normal heart sounds and intact distal pulses.  No murmur heard. Pulmonary/Chest: Effort normal and breath sounds normal. No respiratory distress. She has no wheezes. She has no rales.  Breasts normal female  Abdominal: Soft. Bowel sounds are normal. She exhibits no distension and no mass. There is no tenderness. There is no rebound and no  guarding.  Genitourinary:  Genitourinary Comments: Pap taken in 2017.  Bimanual normal.  Musculoskeletal: Normal range of motion. She exhibits no edema.  Lymphadenopathy:    She has no cervical adenopathy.  Neurological: She is alert and oriented to person, place, and time. She has normal reflexes. No cranial nerve deficit. Coordination normal.  Skin: Skin is warm and dry. No rash noted. She is not diaphoretic.  Psychiatric: She has a normal mood and affect. Her behavior is normal. Judgment and thought content normal.  Vitals reviewed.         Assessment & Plan:  Normal health maintenance exam and lab work  History of migraine headaches-migraine medication refill  Plan: Return in 1 year or as needed.

## 2017-12-01 NOTE — Patient Instructions (Signed)
It was a pleasure to see you today.  Migraine medication refilled.  Labs within normal limits.  Return in 1 year or as needed.

## 2017-12-18 ENCOUNTER — Other Ambulatory Visit: Payer: Self-pay | Admitting: Internal Medicine

## 2017-12-18 DIAGNOSIS — Z1231 Encounter for screening mammogram for malignant neoplasm of breast: Secondary | ICD-10-CM

## 2018-01-08 ENCOUNTER — Ambulatory Visit
Admission: RE | Admit: 2018-01-08 | Discharge: 2018-01-08 | Disposition: A | Discharge status: Home / Self Care | Payer: BLUE CROSS/BLUE SHIELD | Source: Ambulatory Visit | Attending: Internal Medicine | Admitting: Internal Medicine

## 2018-01-08 DIAGNOSIS — Z1231 Encounter for screening mammogram for malignant neoplasm of breast: Secondary | ICD-10-CM

## 2018-10-17 ENCOUNTER — Encounter: Payer: Self-pay | Admitting: Internal Medicine

## 2018-10-17 ENCOUNTER — Ambulatory Visit (INDEPENDENT_AMBULATORY_CARE_PROVIDER_SITE_OTHER): Payer: Managed Care, Other (non HMO) | Admitting: Internal Medicine

## 2018-10-17 VITALS — BP 102/82 | HR 85 | Temp 98.5°F

## 2018-10-17 DIAGNOSIS — Z23 Encounter for immunization: Secondary | ICD-10-CM | POA: Diagnosis not present

## 2018-10-17 DIAGNOSIS — R350 Frequency of micturition: Secondary | ICD-10-CM | POA: Diagnosis not present

## 2018-10-17 DIAGNOSIS — M545 Low back pain, unspecified: Secondary | ICD-10-CM

## 2018-10-17 DIAGNOSIS — N3000 Acute cystitis without hematuria: Secondary | ICD-10-CM | POA: Diagnosis not present

## 2018-10-17 LAB — POCT URINALYSIS DIPSTICK
APPEARANCE: NEGATIVE
BILIRUBIN UA: NEGATIVE
Glucose, UA: NEGATIVE
Ketones, UA: NEGATIVE
LEUKOCYTES UA: NEGATIVE
Nitrite, UA: NEGATIVE
Odor: NEGATIVE
PH UA: 6 (ref 5.0–8.0)
PROTEIN UA: POSITIVE — AB
RBC UA: NEGATIVE
Spec Grav, UA: 1.015 (ref 1.010–1.025)
Urobilinogen, UA: 0.2 E.U./dL

## 2018-10-17 MED ORDER — CIPROFLOXACIN HCL 500 MG PO TABS: 500.0000 mg | ORAL_TABLET | Freq: Two times a day (BID) | ORAL | 1 refills | Status: DC

## 2018-10-17 NOTE — Patient Instructions (Signed)
Cipro 500 mg twice daily for 7 days with 1 refill.  Flu vaccine given.

## 2018-10-17 NOTE — Progress Notes (Signed)
   Subjective:    Patient ID: Andrea Wagner, female    DOB: 06/22/1971, 48 y.o.   MRN: [id number]  HPI Onset yesterday of low back pain, suprapubic pressure and urinary frequency.  Is afraid she is getting a urinary tract infection.  Says that she has a history of pyelonephritis requiring hospitalization in the past.  No fever or chills.  No nausea or vomiting. Last UTI on file here was in January 2018 Says she has upcoming physical exam in the near future.  Review of Systems see above     Objective:   Physical Exam  No CVA  tenderness.  Dipstick UA shows no evidence of LE or nitrite.  Does have protein in urine but has had this previously.      Assessment & Plan:  Cystitis  Plan: Cipro 500 mg twice daily for 7 days with 1 refill.  Urine was not cultured.  Patient requests  flu vaccine.  This was given today.

## 2018-11-04 ENCOUNTER — Other Ambulatory Visit: Payer: Managed Care, Other (non HMO) | Admitting: Internal Medicine

## 2018-11-04 DIAGNOSIS — Z8639 Personal history of other endocrine, nutritional and metabolic disease: Secondary | ICD-10-CM

## 2018-11-04 DIAGNOSIS — Z0000 Encounter for general adult medical examination without abnormal findings: Secondary | ICD-10-CM

## 2018-11-05 LAB — COMPLETE METABOLIC PANEL WITH GFR
AG RATIO: 1.4 (calc) (ref 1.0–2.5)
ALBUMIN MSPROF: 3.8 g/dL (ref 3.6–5.1)
ALKALINE PHOSPHATASE (APISO): 68 U/L (ref 31–125)
ALT: 12 U/L (ref 6–29)
AST: 14 U/L (ref 10–35)
BILIRUBIN TOTAL: 0.3 mg/dL (ref 0.2–1.2)
BUN: 10 mg/dL (ref 7–25)
CHLORIDE: 106 mmol/L (ref 98–110)
CO2: 24 mmol/L (ref 20–32)
CREATININE: 0.82 mg/dL (ref 0.50–1.10)
Calcium: 9.3 mg/dL (ref 8.6–10.2)
GFR, Est African American: 99 mL/min/{1.73_m2} (ref >=60)
GFR, Est Non African American: 85 mL/min/{1.73_m2} (ref >=60)
GLOBULIN: 2.7 g/dL (ref 1.9–3.7)
Glucose, Bld: 89 mg/dL (ref 65–99)
POTASSIUM: 4.3 mmol/L (ref 3.5–5.3)
SODIUM: 140 mmol/L (ref 135–146)
Total Protein: 6.5 g/dL (ref 6.1–8.1)

## 2018-11-05 LAB — CBC WITH DIFFERENTIAL/PLATELET
ABSOLUTE MONOCYTES: 578 {cells}/uL (ref 200–950)
Basophils Absolute: 8 cells/uL (ref 0–200)
Basophils Relative: 0.1 %
EOS ABS: 99 {cells}/uL (ref 15–500)
EOS PCT: 1.3 %
HEMATOCRIT: 34.9 % — AB (ref 35.0–45.0)
HEMOGLOBIN: 11.7 g/dL (ref 11.7–15.5)
LYMPHS ABS: 1376 {cells}/uL (ref 850–3900)
MCH: 26.8 pg — AB (ref 27.0–33.0)
MCHC: 33.5 g/dL (ref 32.0–36.0)
MCV: 79.9 fL — AB (ref 80.0–100.0)
MPV: 11.6 fL (ref 7.5–12.5)
Monocytes Relative: 7.6 %
NEUTROS PCT: 72.9 %
Neutro Abs: 5540 cells/uL (ref 1500–7800)
Platelets: 290 10*3/uL (ref 140–400)
RBC: 4.37 10*6/uL (ref 3.80–5.10)
RDW: 13.7 % (ref 11.0–15.0)
Total Lymphocyte: 18.1 %
WBC: 7.6 10*3/uL (ref 3.8–10.8)

## 2018-11-05 LAB — LIPID PANEL
CHOL/HDL RATIO: 3 (calc) (ref <5.0)
CHOLESTEROL: 183 mg/dL (ref <200)
HDL: 61 mg/dL (ref >=50)
LDL CHOLESTEROL (CALC): 98 mg/dL
NON-HDL CHOLESTEROL (CALC): 122 mg/dL (ref <130)
TRIGLYCERIDES: 141 mg/dL (ref <150)

## 2018-11-05 LAB — TSH: TSH: 0.68 mIU/L

## 2018-11-05 LAB — VITAMIN D 25 HYDROXY (VIT D DEFICIENCY, FRACTURES): VIT D 25 HYDROXY: 23 ng/mL — AB (ref 30–100)

## 2018-11-08 ENCOUNTER — Other Ambulatory Visit (HOSPITAL_COMMUNITY)
Admission: RE | Admit: 2018-11-08 | Discharge: 2018-11-08 | Disposition: A | Discharge status: Home / Self Care | Payer: Managed Care, Other (non HMO) | Source: Ambulatory Visit | Attending: Internal Medicine | Admitting: Internal Medicine

## 2018-11-08 ENCOUNTER — Other Ambulatory Visit: Payer: Self-pay

## 2018-11-08 ENCOUNTER — Encounter: Payer: Self-pay | Admitting: Internal Medicine

## 2018-11-08 ENCOUNTER — Ambulatory Visit (INDEPENDENT_AMBULATORY_CARE_PROVIDER_SITE_OTHER): Payer: Managed Care, Other (non HMO) | Admitting: Internal Medicine

## 2018-11-08 VITALS — BP 110/80 | HR 97 | Ht 66.0 in | Wt 227.0 lb

## 2018-11-08 DIAGNOSIS — Z124 Encounter for screening for malignant neoplasm of cervix: Secondary | ICD-10-CM | POA: Insufficient documentation

## 2018-11-08 DIAGNOSIS — E559 Vitamin D deficiency, unspecified: Secondary | ICD-10-CM

## 2018-11-08 DIAGNOSIS — Z0000 Encounter for general adult medical examination without abnormal findings: Secondary | ICD-10-CM

## 2018-11-08 DIAGNOSIS — Z8669 Personal history of other diseases of the nervous system and sense organs: Secondary | ICD-10-CM | POA: Diagnosis not present

## 2018-11-08 LAB — POCT URINALYSIS DIPSTICK
APPEARANCE: NEGATIVE
Bilirubin, UA: NEGATIVE
Blood, UA: NEGATIVE
Glucose, UA: NEGATIVE
Ketones, UA: NEGATIVE
Leukocytes, UA: NEGATIVE
Nitrite, UA: NEGATIVE
Odor: NEGATIVE
Protein, UA: POSITIVE — AB
Spec Grav, UA: 1.01 (ref 1.010–1.025)
Urobilinogen, UA: 0.2 E.U./dL
pH, UA: 6.5 (ref 5.0–8.0)

## 2018-11-08 MED ORDER — ERGOCALCIFEROL 1.25 MG (50000 UT) PO CAPS: 50000.0000 [IU] | ORAL_CAPSULE | ORAL | 1 refills | Status: DC

## 2018-11-08 MED ORDER — BUTALBITAL-APAP-CAFF-COD 50-325-40-30 MG PO CAPS: 1.0000 | ORAL_CAPSULE | Freq: Four times a day (QID) | ORAL | 0 refills | Status: DC | PRN

## 2018-11-08 MED ORDER — IBUPROFEN 800 MG PO TABS: 800.0000 mg | ORAL_TABLET | Freq: Three times a day (TID) | ORAL | 1 refills | Status: DC | PRN

## 2018-11-08 NOTE — Progress Notes (Signed)
   Subjective:    Patient ID: Andrea Wagner, female    DOB: [birth year], 48 y.o.   MRN: [id number]  HPI Pleasant 48 year old Female in today for health maintenance exam and evaluation of medical issues.  Has migraine headaches treated with Fioricet.  General health is good.  Still has issues with metrorrhagia at times.  Vitamin D deficiency with level of 23.  MCV 79.9 close to normal.  Needs to take iron supplement.  Hemoglobin 11.7 g and stable.  Lipids are normal.  TSH is normal.  C met is normal.  Social history: She is married.  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  Does not smoke.  Mother with history of hip replacement, history of Guyon Barr syndrome affecting her lower extremities and hands.    Review of Systems no new complaints     Objective:   Physical Exam Vitals signs reviewed.  Constitutional:      General: She is not in acute distress.    Appearance: Normal appearance.  HENT:     Head: Normocephalic and atraumatic.     Right Ear: Tympanic membrane normal.     Left Ear: Tympanic membrane normal.     Nose: Nose normal.     Mouth/Throat:     Mouth: Mucous membranes are moist.     Pharynx: Oropharynx is clear.  Eyes:     General: No scleral icterus.       Right eye: No discharge.        Left eye: No discharge.     Extraocular Movements: Extraocular movements intact.     Conjunctiva/sclera: Conjunctivae normal.     Pupils: Pupils are equal, round, and reactive to light.  Neck:     Musculoskeletal: Neck supple. No neck rigidity.     Comments: No thyromegaly Cardiovascular:     Rate and Rhythm: Normal rate and regular rhythm.     Heart sounds: Normal heart sounds. No murmur.  Pulmonary:     Effort: Pulmonary effort is normal.     Breath sounds: Normal breath sounds. No wheezing or rales.     Comments: Breast are without masses Abdominal:     General: Bowel sounds are normal.     Palpations: Abdomen is soft. There is no mass.   Tenderness: There is no right CVA tenderness, left CVA tenderness, guarding or rebound.  Genitourinary:    Comments: Pap taken.  Bimanual normal. Lymphadenopathy:     Cervical: No cervical adenopathy.  Skin:    General: Skin is warm and dry.  Neurological:     General: No focal deficit present.     Mental Status: She is alert and oriented to person, place, and time.     Cranial Nerves: No cranial nerve deficit.     Coordination: Coordination normal.     Gait: Gait normal.  Psychiatric:        Mood and Affect: Mood normal.        Behavior: Behavior normal.        Thought Content: Thought content normal.        Judgment: Judgment normal.           Assessment & Plan:  Normal health maintenance exam  Vitamin D deficiency-make sure she takes vitamin D on a regular basis.  Level is 23.  History of migraine headaches-Fioricet refilled  MCV slightly low-take iron supplement with history of menorrhagia  Plan: Return in 1 year or as needed.

## 2018-11-12 LAB — CYTOLOGY - PAP
Diagnosis: NEGATIVE
HPV: NOT DETECTED

## 2018-11-30 NOTE — Patient Instructions (Signed)
Be sure to take vitamin D supplement regularly.  Fioricet refilled for migraine headaches.  It was a pleasure to see you today.  Return in 1 year.

## 2019-01-03 ENCOUNTER — Other Ambulatory Visit: Payer: Self-pay | Admitting: Internal Medicine

## 2019-01-30 ENCOUNTER — Ambulatory Visit
Admission: RE | Admit: 2019-01-30 | Discharge: 2019-01-30 | Disposition: A | Discharge status: Home / Self Care | Payer: Managed Care, Other (non HMO) | Source: Ambulatory Visit | Attending: Internal Medicine | Admitting: Internal Medicine

## 2019-01-30 ENCOUNTER — Other Ambulatory Visit: Payer: Self-pay

## 2019-08-11 ENCOUNTER — Telehealth: Payer: Self-pay | Admitting: Internal Medicine

## 2019-08-11 NOTE — Telephone Encounter (Signed)
Called patient back and she has no GYN, so I will do referral

## 2019-08-11 NOTE — Telephone Encounter (Signed)
Andrea Wagner [phone]  Andrea Wagner called to say for the last month she has been spotting and bleeding. Some days worse than others, she has to wear pad all the time. She is wandering if this could be starting menopause, would like to come in and talk with you about it.

## 2019-08-11 NOTE — Telephone Encounter (Signed)
This needs to be addressed by GYN.

## 2019-08-11 NOTE — Telephone Encounter (Signed)
Referral placed.

## 2019-10-14 ENCOUNTER — Encounter: Payer: Managed Care, Other (non HMO) | Admitting: Obstetrics and Gynecology

## 2019-11-12 ENCOUNTER — Other Ambulatory Visit: Payer: Self-pay

## 2019-11-12 ENCOUNTER — Ambulatory Visit (INDEPENDENT_AMBULATORY_CARE_PROVIDER_SITE_OTHER): Payer: Managed Care, Other (non HMO) | Admitting: Obstetrics & Gynecology

## 2019-11-12 ENCOUNTER — Encounter: Payer: Self-pay | Admitting: Obstetrics & Gynecology

## 2019-11-12 VITALS — BP 120/84 | HR 81 | Wt 222.6 lb

## 2019-11-12 DIAGNOSIS — Z23 Encounter for immunization: Secondary | ICD-10-CM | POA: Diagnosis not present

## 2019-11-12 DIAGNOSIS — N938 Other specified abnormal uterine and vaginal bleeding: Secondary | ICD-10-CM

## 2019-11-12 NOTE — Patient Instructions (Signed)

## 2019-11-12 NOTE — Progress Notes (Signed)
Patient ID: Andrea Wagner, female   DOB: 10/08/1970, 49 y.o.   MRN: [id number]  Cc: abnormal uterine bleeding  HPI Andrea Wagner is a 49 y.o. female.  [id number] Patient's last menstrual period was 10/19/2019 (approximate). Since December she has spotted for a few days prior to her menses. Flow then lasts 3 days and is less heavy than the past and less pain. HPI  Past Medical History:  Diagnosis Date  . Migraine   . Pyelonephritis     Past Surgical History:  Procedure Laterality Date  . TUBAL LIGATION    . WISDOM TOOTH EXTRACTION      Family History  Problem Relation Age of Onset  . Hypertension Mother   . Diabetes Mother   . Diabetes Father   . Heart disease Father     Social History Social History   Tobacco Use  . Smoking status: Never Smoker  . Smokeless tobacco: Never Used  Substance Use Topics  . Alcohol use: Yes    Comment: rarely  . Drug use: No    Allergies  Allergen Reactions  . Sulfa Antibiotics Swelling    Current Outpatient Medications  Medication Sig Dispense Refill  . Aspirin-Acetaminophen-Caffeine (EXCEDRIN MIGRAINE PO) Take by mouth.    . butalbital-acetaminophen-caffeine (FIORICET WITH CODEINE) 50-325-40-30 MG capsule Take 1 capsule by mouth every 6 (six) hours as needed for headache. 28 capsule 0  . ibuprofen (ADVIL) 800 MG tablet TAKE 1 TABLET BY MOUTH EVERY 8 HOURS AS NEEDED 90 tablet 1  . ergocalciferol (DRISDOL) 1.25 MG (50000 UT) capsule Take 1 capsule (50,000 Units total) by mouth once a week. (Patient not taking: Reported on 11/12/2019) 12 capsule 1   No current facility-administered medications for this visit.    Review of Systems Review of Systems  Constitutional: Negative.   Respiratory: Negative.   Gastrointestinal: Negative.   Endocrine:       Occasional hot flushes  Genitourinary: Positive for menstrual problem. Negative for pelvic pain, vaginal bleeding and vaginal discharge.    Blood pressure 120/84, pulse 81,  weight 222 lb 9.6 oz (101 kg), last menstrual period 10/19/2019. Body mass index is 35.93 kg/m.  Physical Exam Physical Exam Constitutional:      Appearance: Normal appearance.  Cardiovascular:     Rate and Rhythm: Normal rate.  Pulmonary:     Effort: Pulmonary effort is normal.  Abdominal:     General: There is no distension.  Skin:    General: Skin is warm and dry.  Neurological:     Mental Status: She is alert.  Psychiatric:        Mood and Affect: Mood normal.        Behavior: Behavior normal.     Data Reviewed Pap 2020, notes from Dr. Lenord Fellers  Assessment Perimenopausal DUB  Plan Orders Placed This Encounter  Procedures  . US PELVIC COMPLETE WITH TRANSVAGINAL  . Flu Vaccine QUAD 36+ mos IM  . FSH   F/u prn if sx worsen, her sx are not severe and are typical for perimenopause. I will review test results and notify her via MyChart    Scheryl Darter 11/12/2019, 9:42 AM

## 2019-11-13 LAB — FOLLICLE STIMULATING HORMONE: FSH: 16 m[IU]/mL

## 2019-11-14 ENCOUNTER — Other Ambulatory Visit: Payer: Managed Care, Other (non HMO) | Admitting: Internal Medicine

## 2019-11-14 ENCOUNTER — Other Ambulatory Visit: Payer: Self-pay

## 2019-11-14 DIAGNOSIS — Z1329 Encounter for screening for other suspected endocrine disorder: Secondary | ICD-10-CM

## 2019-11-14 DIAGNOSIS — Z13220 Encounter for screening for lipoid disorders: Secondary | ICD-10-CM

## 2019-11-14 DIAGNOSIS — E559 Vitamin D deficiency, unspecified: Secondary | ICD-10-CM

## 2019-11-14 DIAGNOSIS — Z0000 Encounter for general adult medical examination without abnormal findings: Secondary | ICD-10-CM

## 2019-11-15 LAB — COMPLETE METABOLIC PANEL WITH GFR
AG Ratio: 1.4 (calc) (ref 1.0–2.5)
ALT: 14 U/L (ref 6–29)
AST: 14 U/L (ref 10–35)
Albumin: 3.7 g/dL (ref 3.6–5.1)
Alkaline phosphatase (APISO): 75 U/L (ref 31–125)
BUN: 10 mg/dL (ref 7–25)
CO2: 26 mmol/L (ref 20–32)
Calcium: 9.2 mg/dL (ref 8.6–10.2)
Chloride: 105 mmol/L (ref 98–110)
Creat: 0.81 mg/dL (ref 0.50–1.10)
GFR, Est African American: 100 mL/min/{1.73_m2} (ref >=60)
GFR, Est Non African American: 86 mL/min/{1.73_m2} (ref >=60)
Globulin: 2.6 g/dL (calc) (ref 1.9–3.7)
Glucose, Bld: 79 mg/dL (ref 65–99)
Potassium: 3.9 mmol/L (ref 3.5–5.3)
Sodium: 141 mmol/L (ref 135–146)
Total Bilirubin: 0.3 mg/dL (ref 0.2–1.2)
Total Protein: 6.3 g/dL (ref 6.1–8.1)

## 2019-11-15 LAB — CBC WITH DIFFERENTIAL/PLATELET
Absolute Monocytes: 576 cells/uL (ref 200–950)
Basophils Absolute: 9 cells/uL (ref 0–200)
Basophils Relative: 0.1 %
Eosinophils Absolute: 90 cells/uL (ref 15–500)
Eosinophils Relative: 1 %
HCT: 35.9 % (ref 35.0–45.0)
Hemoglobin: 11.8 g/dL (ref 11.7–15.5)
Lymphs Abs: 1647 cells/uL (ref 850–3900)
MCH: 27.1 pg (ref 27.0–33.0)
MCHC: 32.9 g/dL (ref 32.0–36.0)
MCV: 82.5 fL (ref 80.0–100.0)
MPV: 12.6 fL — ABNORMAL HIGH (ref 7.5–12.5)
Monocytes Relative: 6.4 %
Neutro Abs: 6678 cells/uL (ref 1500–7800)
Neutrophils Relative %: 74.2 %
Platelets: 248 10*3/uL (ref 140–400)
RBC: 4.35 10*6/uL (ref 3.80–5.10)
RDW: 13.1 % (ref 11.0–15.0)
Total Lymphocyte: 18.3 %
WBC: 9 10*3/uL (ref 3.8–10.8)

## 2019-11-15 LAB — VITAMIN D 25 HYDROXY (VIT D DEFICIENCY, FRACTURES): Vit D, 25-Hydroxy: 27 ng/mL — ABNORMAL LOW (ref 30–100)

## 2019-11-15 LAB — LIPID PANEL
Cholesterol: 190 mg/dL (ref <200)
HDL: 75 mg/dL (ref >=50)
LDL Cholesterol (Calc): 96 mg/dL (calc)
Non-HDL Cholesterol (Calc): 115 mg/dL (calc) (ref <130)
Total CHOL/HDL Ratio: 2.5 (calc) (ref <5.0)
Triglycerides: 99 mg/dL (ref <150)

## 2019-11-15 LAB — TSH: TSH: 0.82 mIU/L

## 2019-11-17 ENCOUNTER — Other Ambulatory Visit: Payer: Self-pay

## 2019-11-17 ENCOUNTER — Encounter: Payer: Self-pay | Admitting: Internal Medicine

## 2019-11-17 ENCOUNTER — Ambulatory Visit (INDEPENDENT_AMBULATORY_CARE_PROVIDER_SITE_OTHER): Payer: Managed Care, Other (non HMO) | Admitting: Internal Medicine

## 2019-11-17 VITALS — BP 110/80 | HR 96 | Ht 66.0 in | Wt 222.0 lb

## 2019-11-17 DIAGNOSIS — E559 Vitamin D deficiency, unspecified: Secondary | ICD-10-CM | POA: Diagnosis not present

## 2019-11-17 DIAGNOSIS — Z8669 Personal history of other diseases of the nervous system and sense organs: Secondary | ICD-10-CM

## 2019-11-17 DIAGNOSIS — Z0000 Encounter for general adult medical examination without abnormal findings: Secondary | ICD-10-CM

## 2019-11-17 DIAGNOSIS — R609 Edema, unspecified: Secondary | ICD-10-CM

## 2019-11-17 LAB — POCT URINALYSIS DIPSTICK
Appearance: NEGATIVE
Bilirubin, UA: NEGATIVE
Blood, UA: NEGATIVE
Glucose, UA: NEGATIVE
Ketones, UA: NEGATIVE
Leukocytes, UA: NEGATIVE
Nitrite, UA: NEGATIVE
Odor: NEGATIVE
Protein, UA: POSITIVE — AB
Spec Grav, UA: 1.01 (ref 1.010–1.025)
Urobilinogen, UA: 0.2 E.U./dL
pH, UA: 6.5 (ref 5.0–8.0)

## 2019-11-17 MED ORDER — FUROSEMIDE 20 MG PO TABS: 20.0000 mg | ORAL_TABLET | Freq: Every day | ORAL | 3 refills | Status: DC

## 2019-11-17 MED ORDER — ERGOCALCIFEROL 1.25 MG (50000 UT) PO CAPS: 50000.0000 [IU] | ORAL_CAPSULE | ORAL | 3 refills | Status: DC

## 2019-11-17 NOTE — Progress Notes (Signed)
Subjective:    Patient ID: Andrea Wagner, female    DOB: 1970-12-18, 49 y.o.   MRN: [id number]  HPI 49 year old Female for health maintenance and evaluation of medical issues.  Vitamin D level is low at 27 and she will be placed on high-dose weekly vitamin D therapy.  TSH is normal.  Lipid panel is normal c-Met is normal.  CBC is within normal limits.  She has a longstanding history of migraine headaches treated with as needed Fioricet and this was refilled today.  Works well for her.  Has developed dependent edema over the past year.  Is on her feet a lot with her work.  We will prescribe her Lasix 20 mg to take on a as needed basis.  Social history: She is married.  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  Does not smoke.  Family history: Mother with history of hip replacement and history of Guillain-Barr  syndrome affecting her lower extremities and hands.    Review of Systems  Constitutional: Negative.   HENT: Negative.   Respiratory: Negative.   Cardiovascular: Negative.        Dependent edema  Gastrointestinal: Negative.   Genitourinary: Negative.   Neurological:       History of migraine headache aches  Psychiatric/Behavioral: Negative.        Objective:   Physical Exam Constitutional:      Appearance: Normal appearance.  HENT:     Head: Normocephalic and atraumatic.     Right Ear: Tympanic membrane normal.     Left Ear: Tympanic membrane normal.     Nose: Nose normal.  Eyes:     General: No scleral icterus.       Right eye: No discharge.        Left eye: No discharge.     Extraocular Movements: Extraocular movements intact.     Conjunctiva/sclera: Conjunctivae normal.     Pupils: Pupils are equal, round, and reactive to light.  Neck:     Vascular: No carotid bruit.     Comments: No thyromegaly Cardiovascular:     Rate and Rhythm: Normal rate and regular rhythm.     Pulses: Normal pulses.     Heart sounds: Normal heart sounds. No  murmur.  Pulmonary:     Effort: Pulmonary effort is normal. No respiratory distress.     Breath sounds: Normal breath sounds. No wheezing or rales.  Abdominal:     General: Bowel sounds are normal. There is no distension.     Palpations: Abdomen is soft. There is no mass.     Tenderness: There is no abdominal tenderness.  Genitourinary:    Comments: Pap done in 2020.  Bimanual normal. Musculoskeletal:     Cervical back: Neck supple. No rigidity.     Comments: Trace pitting lower extremity edema bilaterally.  Lymphadenopathy:     Cervical: No cervical adenopathy.  Skin:    General: Skin is warm and dry.     Findings: No rash.  Neurological:     General: No focal deficit present.     Mental Status: She is alert.     Cranial Nerves: No cranial nerve deficit.     Sensory: No sensory deficit.     Coordination: Coordination normal.     Gait: Gait normal.  Psychiatric:        Mood and Affect: Mood normal.        Behavior: Behavior normal.  Thought Content: Thought content normal.           Assessment & Plan:  History of migraine headaches-longstanding treated successfully with Fioricet on a as needed basis.  Okay to refill medication.  Health maintenance-order for mammogram placed  Dependent edema-new.  Treat with Lasix 20 mg on a as needed basis.  Aggravated by being on her feet all day with likely venous insufficiency  History of vitamin D deficiency-stay on high-dose vitamin D weekly.  Plan: Return in 1 year or as needed.  Has had both COVID-19 immunizations.  Tetanus immunization is up-to-date.  Gets annual flu vaccine.

## 2019-11-25 ENCOUNTER — Other Ambulatory Visit: Payer: Self-pay

## 2019-11-25 MED ORDER — BUTALBITAL-APAP-CAFF-COD 50-325-40-30 MG PO CAPS: 1.0000 | ORAL_CAPSULE | Freq: Four times a day (QID) | ORAL | 0 refills | Status: DC | PRN

## 2019-11-30 DIAGNOSIS — R609 Edema, unspecified: Secondary | ICD-10-CM | POA: Insufficient documentation

## 2019-11-30 NOTE — Patient Instructions (Signed)
Continue on high-dose vitamin D for vitamin D deficiency.  Fioricet refilled for migraine headaches.  Take Lasix 20 mg on a as needed basis for dependent edema.

## 2019-12-12 NOTE — Progress Notes (Signed)
Patient was notified she said she will call to schedule that.

## 2020-02-04 ENCOUNTER — Ambulatory Visit
Admission: RE | Admit: 2020-02-04 | Discharge: 2020-02-04 | Disposition: A | Discharge status: Home / Self Care | Payer: Managed Care, Other (non HMO) | Source: Ambulatory Visit | Attending: Internal Medicine | Admitting: Internal Medicine

## 2020-02-04 ENCOUNTER — Other Ambulatory Visit: Payer: Self-pay

## 2020-02-04 ENCOUNTER — Ambulatory Visit: Payer: Managed Care, Other (non HMO)

## 2020-02-15 ENCOUNTER — Other Ambulatory Visit: Payer: Self-pay | Admitting: Internal Medicine

## 2020-08-18 ENCOUNTER — Ambulatory Visit (INDEPENDENT_AMBULATORY_CARE_PROVIDER_SITE_OTHER): Payer: Managed Care, Other (non HMO) | Admitting: Internal Medicine

## 2020-08-18 ENCOUNTER — Other Ambulatory Visit: Payer: Self-pay

## 2020-08-18 DIAGNOSIS — Z23 Encounter for immunization: Secondary | ICD-10-CM

## 2020-08-18 NOTE — Patient Instructions (Signed)
Flu vaccine per CMA 

## 2020-08-18 NOTE — Progress Notes (Signed)
Patient seen for flu vaccine.  Tolerated well.

## 2020-11-11 ENCOUNTER — Other Ambulatory Visit: Payer: Managed Care, Other (non HMO) | Admitting: Internal Medicine

## 2020-11-11 ENCOUNTER — Other Ambulatory Visit: Payer: Self-pay

## 2020-11-11 DIAGNOSIS — E559 Vitamin D deficiency, unspecified: Secondary | ICD-10-CM

## 2020-11-11 DIAGNOSIS — Z13220 Encounter for screening for lipoid disorders: Secondary | ICD-10-CM

## 2020-11-11 DIAGNOSIS — Z1329 Encounter for screening for other suspected endocrine disorder: Secondary | ICD-10-CM

## 2020-11-11 DIAGNOSIS — Z0000 Encounter for general adult medical examination without abnormal findings: Secondary | ICD-10-CM

## 2020-11-12 LAB — CBC WITH DIFFERENTIAL/PLATELET
Absolute Monocytes: 577 cells/uL (ref 200–950)
Basophils Absolute: 39 cells/uL (ref 0–200)
Basophils Relative: 0.5 %
Eosinophils Absolute: 172 cells/uL (ref 15–500)
Eosinophils Relative: 2.2 %
HCT: 36 % (ref 35.0–45.0)
Hemoglobin: 11.8 g/dL (ref 11.7–15.5)
Lymphs Abs: 1630 cells/uL (ref 850–3900)
MCH: 26.6 pg — ABNORMAL LOW (ref 27.0–33.0)
MCHC: 32.8 g/dL (ref 32.0–36.0)
MCV: 81.3 fL (ref 80.0–100.0)
MPV: 12.3 fL (ref 7.5–12.5)
Monocytes Relative: 7.4 %
Neutro Abs: 5382 cells/uL (ref 1500–7800)
Neutrophils Relative %: 69 %
Platelets: 275 10*3/uL (ref 140–400)
RBC: 4.43 10*6/uL (ref 3.80–5.10)
RDW: 13.8 % (ref 11.0–15.0)
Total Lymphocyte: 20.9 %
WBC: 7.8 10*3/uL (ref 3.8–10.8)

## 2020-11-12 LAB — COMPLETE METABOLIC PANEL WITH GFR
AG Ratio: 1.5 (calc) (ref 1.0–2.5)
ALT: 27 U/L (ref 6–29)
AST: 23 U/L (ref 10–35)
Albumin: 4 g/dL (ref 3.6–5.1)
Alkaline phosphatase (APISO): 72 U/L (ref 31–125)
BUN: 12 mg/dL (ref 7–25)
CO2: 29 mmol/L (ref 20–32)
Calcium: 9.6 mg/dL (ref 8.6–10.2)
Chloride: 105 mmol/L (ref 98–110)
Creat: 0.82 mg/dL (ref 0.50–1.10)
GFR, Est African American: 97 mL/min/{1.73_m2} (ref >=60)
GFR, Est Non African American: 84 mL/min/{1.73_m2} (ref >=60)
Globulin: 2.7 g/dL (calc) (ref 1.9–3.7)
Glucose, Bld: 89 mg/dL (ref 65–99)
Potassium: 4.5 mmol/L (ref 3.5–5.3)
Sodium: 141 mmol/L (ref 135–146)
Total Bilirubin: 0.3 mg/dL (ref 0.2–1.2)
Total Protein: 6.7 g/dL (ref 6.1–8.1)

## 2020-11-12 LAB — LIPID PANEL
Cholesterol: 212 mg/dL — ABNORMAL HIGH (ref <200)
HDL: 81 mg/dL (ref >=50)
LDL Cholesterol (Calc): 106 mg/dL (calc) — ABNORMAL HIGH
Non-HDL Cholesterol (Calc): 131 mg/dL (calc) — ABNORMAL HIGH (ref <130)
Total CHOL/HDL Ratio: 2.6 (calc) (ref <5.0)
Triglycerides: 136 mg/dL (ref <150)

## 2020-11-12 LAB — VITAMIN D 25 HYDROXY (VIT D DEFICIENCY, FRACTURES): Vit D, 25-Hydroxy: 38 ng/mL (ref 30–100)

## 2020-11-12 LAB — TSH: TSH: 0.81 mIU/L

## 2020-11-18 ENCOUNTER — Ambulatory Visit (INDEPENDENT_AMBULATORY_CARE_PROVIDER_SITE_OTHER): Payer: Managed Care, Other (non HMO) | Admitting: Internal Medicine

## 2020-11-18 ENCOUNTER — Encounter: Payer: Self-pay | Admitting: Internal Medicine

## 2020-11-18 ENCOUNTER — Other Ambulatory Visit: Payer: Self-pay

## 2020-11-18 VITALS — BP 110/80 | HR 76 | Ht 66.0 in | Wt 222.0 lb

## 2020-11-18 DIAGNOSIS — R809 Proteinuria, unspecified: Secondary | ICD-10-CM | POA: Diagnosis not present

## 2020-11-18 DIAGNOSIS — Z8669 Personal history of other diseases of the nervous system and sense organs: Secondary | ICD-10-CM

## 2020-11-18 DIAGNOSIS — Z0000 Encounter for general adult medical examination without abnormal findings: Secondary | ICD-10-CM

## 2020-11-18 DIAGNOSIS — Z6835 Body mass index (BMI) 35.0-35.9, adult: Secondary | ICD-10-CM

## 2020-11-18 LAB — POCT URINALYSIS DIPSTICK
Appearance: NEGATIVE
Bilirubin, UA: NEGATIVE
Blood, UA: NEGATIVE
Glucose, UA: NEGATIVE
Ketones, UA: NEGATIVE
Leukocytes, UA: NEGATIVE
Nitrite, UA: NEGATIVE
Odor: NEGATIVE
Protein, UA: POSITIVE — AB
Spec Grav, UA: 1.01 (ref 1.010–1.025)
Urobilinogen, UA: 0.2 E.U./dL
pH, UA: 6.5 (ref 5.0–8.0)

## 2020-11-18 MED ORDER — ERGOCALCIFEROL 1.25 MG (50000 UT) PO CAPS: 50000.0000 [IU] | ORAL_CAPSULE | ORAL | 3 refills | Status: DC

## 2020-11-18 MED ORDER — IBUPROFEN 800 MG PO TABS: 800.0000 mg | ORAL_TABLET | Freq: Three times a day (TID) | ORAL | 1 refills | Status: DC | PRN

## 2020-11-18 MED ORDER — BUTALBITAL-APAP-CAFF-COD 50-325-40-30 MG PO CAPS: 1.0000 | ORAL_CAPSULE | Freq: Four times a day (QID) | ORAL | 1 refills | Status: DC | PRN

## 2020-11-18 NOTE — Progress Notes (Signed)
   Subjective:    Patient ID: Andrea Wagner, female    DOB: 07-08-1971, 50 y.o.   MRN: [id number]  HPI 50 year old Female seen for health maintenance exam and evaluation of medical issues.  She has a history of migraine headaches and vitamin D deficiency.  Has bilateral knee pain.  Has had issues with dependent edema and takes Lasix 20 mg daily for that.  Takes ibuprofen 800 mg up to 3 times daily for knee pain.  Is on high-dose vitamin D for vitamin D deficiency.  Takes Fioricet with codeine for migraine headaches on an as-needed basis.  Does not have them all that often.  She had flu vaccine in December.  Has had 3 COVID vaccines but we only have 2 on file.  She will need to call us with third 1.  Tetanus immunization is up-to-date.  Had mammogram in June 2021 which was normal.  Social history: She is married.  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  Does not smoke.  Family history: Mother with history of hip replacement and history of Guyon Barr syndrome affecting her lower extremities and hands.  Review of Systems no new complaints.  Longstanding history of migraine headaches and dependent edema.  No polyuria or polydipsia.     Objective:   Physical Exam Blood pressure excellent 110/80 pulse 76 pulse oximetry 97% weight 222 pounds BMI 35.83  Skin: Warm and dry.  No cervical adenopathy.  TMs are clear.  Neck is supple without JVD thyromegaly or carotid bruits.  Chest is clear to auscultation without rales or wheezing.  Cardiac exam: Regular rate and rhythm normal S1 and S2 without murmurs or gallops.  Breasts are without masses.  Abdomen is soft nondistended without hepatosplenomegaly masses or tenderness.  Trace lower extremity edema.  Bimanual exam is normal.  Had Pap smear in 2020.       Assessment & Plan:  Mild elevation of total cholesterol and LDL cholesterol at 212 and 106 respectively.  Has good HDL  cholesterol of 81 and normal triglycerides.  Microalbuminuria-continue to watch for development of kidney disease and/or diabetes.  This is a new finding.  Dependent edema treated with as needed Lasix  History of migraine headaches treated with Fioricet with codeine on an as-needed basis  Musculoskeletal pain treated with ibuprofen as needed for bilateral knee pain  History of vitamin D deficiency-take vitamin D 50,000 units weekly for the next year.  Level falls within normal limits but is at lower end of normal.  Health maintenance-order placed for mammogram.  She will be due for colonoscopy next year as well as Pap smear.  BMI 35-watch diet and try to get more exercise

## 2020-11-19 ENCOUNTER — Other Ambulatory Visit: Payer: Self-pay

## 2020-11-19 DIAGNOSIS — R809 Proteinuria, unspecified: Secondary | ICD-10-CM

## 2020-11-19 LAB — MICROALBUMIN / CREATININE URINE RATIO
Creatinine, Urine: 102 mg/dL (ref 20–275)
Microalb Creat Ratio: 768 mcg/mg creat — ABNORMAL HIGH (ref <30)
Microalb, Ur: 78.3 mg/dL

## 2020-11-28 ENCOUNTER — Encounter: Payer: Self-pay | Admitting: Internal Medicine

## 2020-11-28 NOTE — Patient Instructions (Signed)
It was a pleasure to see you today.  Continue Fioricet with codeine on a as needed basis for migraine headaches.  Have mammogram in June.  Mammogram and Pap smear are due next year.  Immunizations are up-to-date.  Return in 1 year or as needed.  Watch diet and try to get some exercise outside of work.

## 2021-01-24 ENCOUNTER — Other Ambulatory Visit: Payer: Self-pay | Admitting: Internal Medicine

## 2021-02-25 ENCOUNTER — Other Ambulatory Visit: Payer: Self-pay | Admitting: Internal Medicine

## 2021-02-25 DIAGNOSIS — Z1231 Encounter for screening mammogram for malignant neoplasm of breast: Secondary | ICD-10-CM

## 2021-03-01 ENCOUNTER — Ambulatory Visit
Admission: RE | Admit: 2021-03-01 | Discharge: 2021-03-01 | Disposition: A | Discharge status: Home / Self Care | Payer: Managed Care, Other (non HMO) | Source: Ambulatory Visit

## 2021-03-01 ENCOUNTER — Other Ambulatory Visit: Payer: Self-pay

## 2021-03-01 DIAGNOSIS — Z1231 Encounter for screening mammogram for malignant neoplasm of breast: Secondary | ICD-10-CM

## 2021-03-10 ENCOUNTER — Encounter: Payer: Self-pay | Admitting: Internal Medicine

## 2021-03-10 LAB — HM COLONOSCOPY

## 2021-03-14 ENCOUNTER — Encounter: Payer: Self-pay | Admitting: Internal Medicine

## 2021-03-18 NOTE — Progress Notes (Signed)
Order(s) created erroneously. Erroneous order ID: [id number]  Order moved by: SHEALY, DEBRA D  Order move date/time: 03/18/2021 11:01 AM  Source Patient: [id number]  Source Contact: 03/10/2021  Destination Patient: [id number]  Destination Contact: 04/22/2020 

## 2021-11-14 IMAGING — MG DIGITAL SCREENING BILAT W/ CAD
5 series · 5 of 5 positions shown · non-contrast
Comparison: Previous exam(s).

CLINICAL DATA: Screening.

EXAM:
DIGITAL SCREENING BILATERAL MAMMOGRAM WITH CAD
TECHNIQUE: Bilateral screening digital craniocaudal and mediolateral oblique
mammograms were obtained. The images were evaluated with
computer-aided detection.

[L CC (1 of 2)]
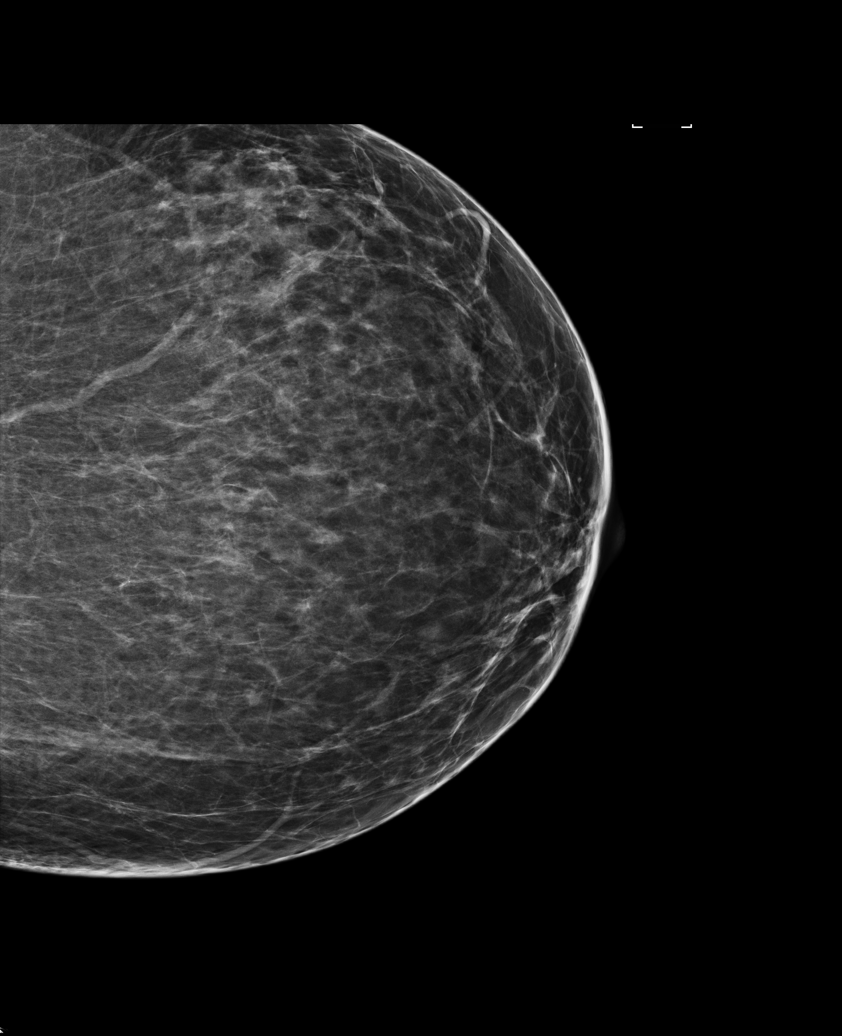

[L CC (2 of 2)]
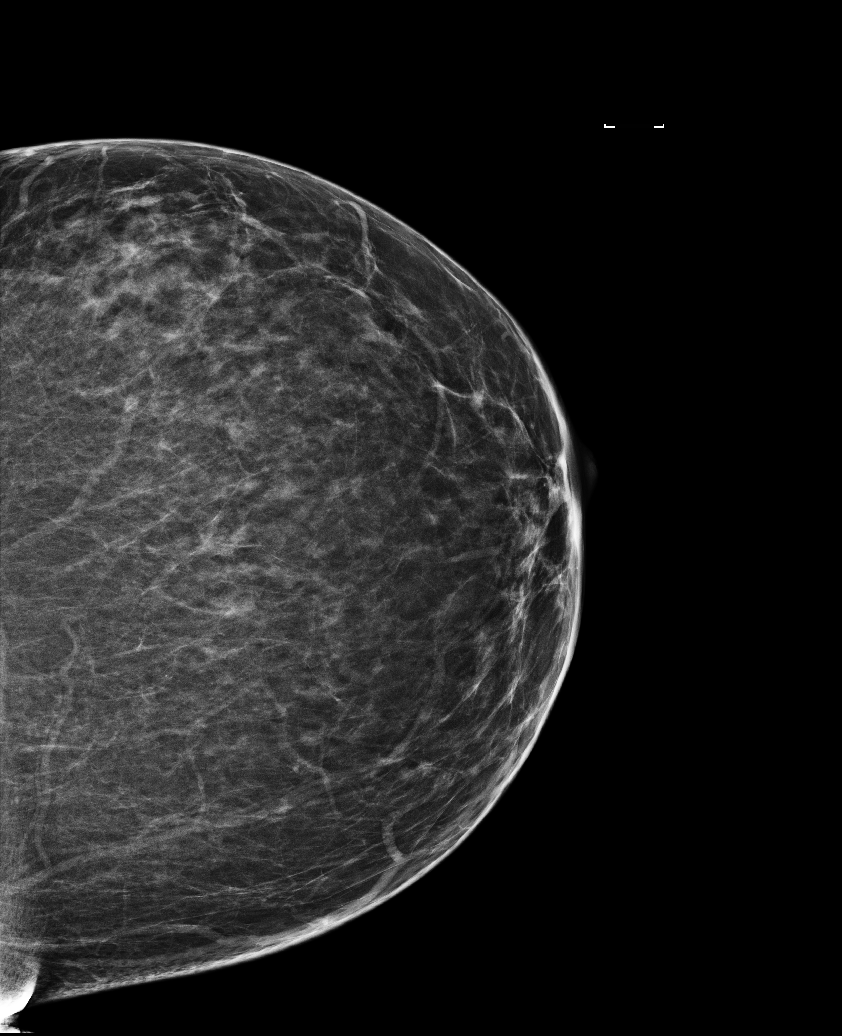

[R MLO]
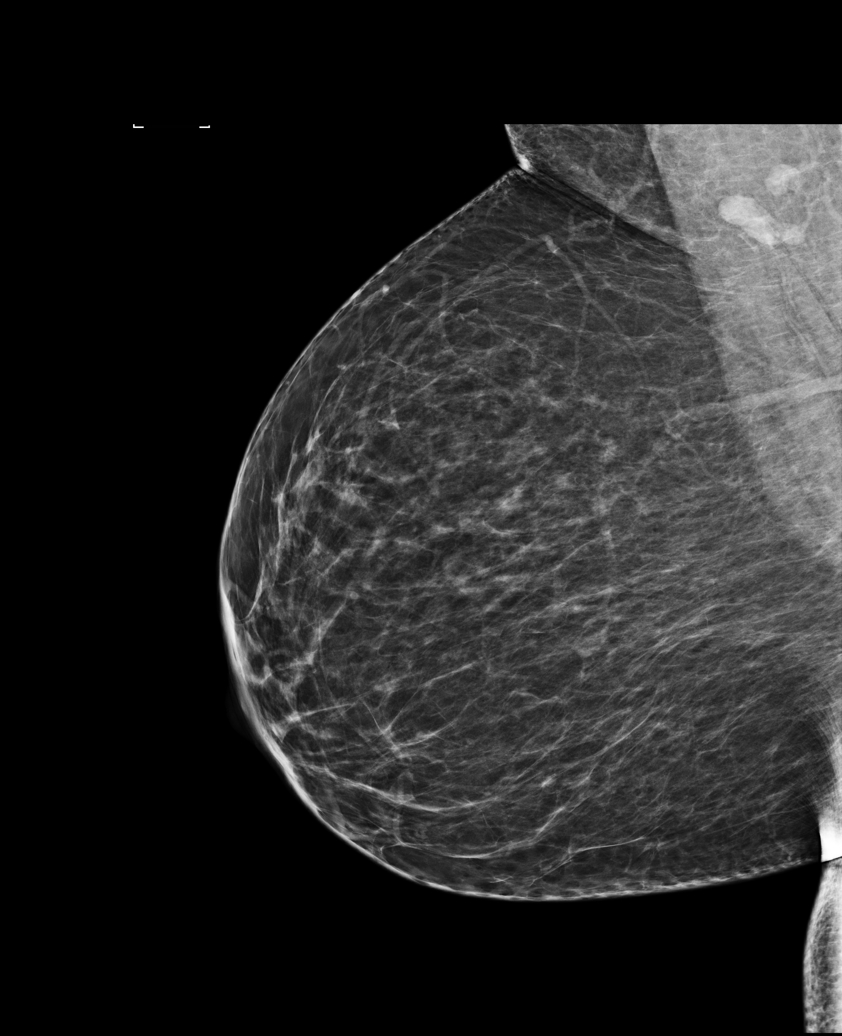

[L MLO]
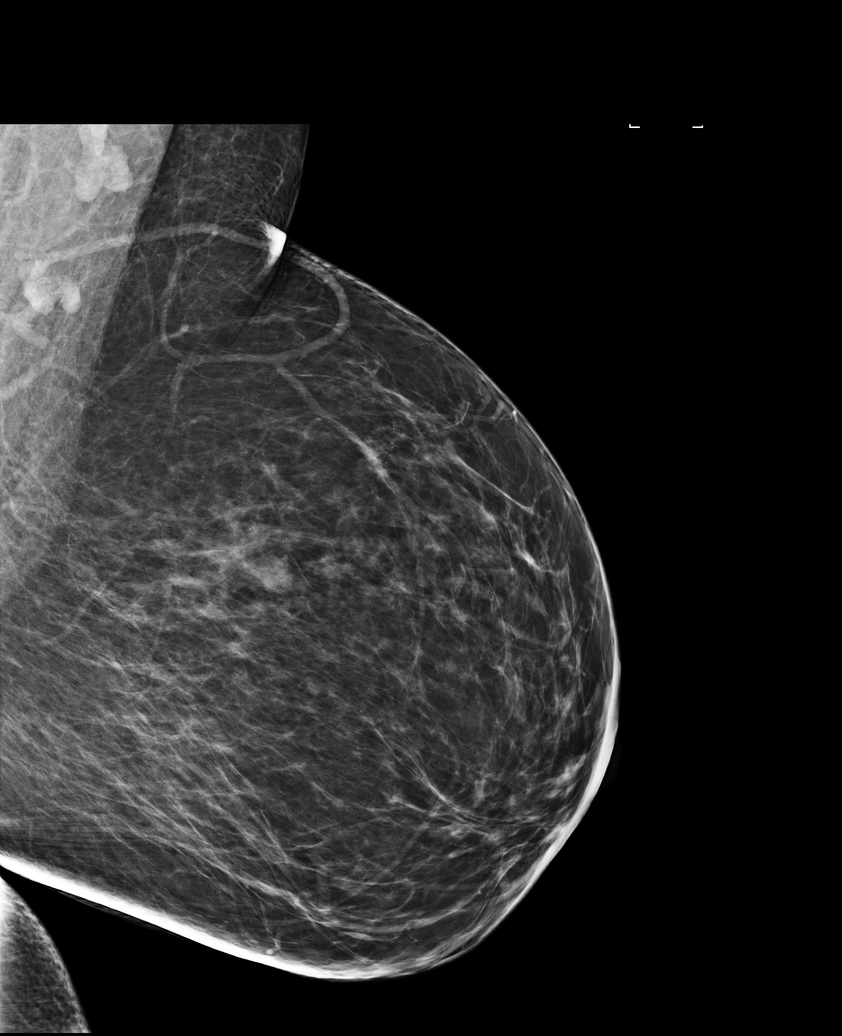

[R CC]
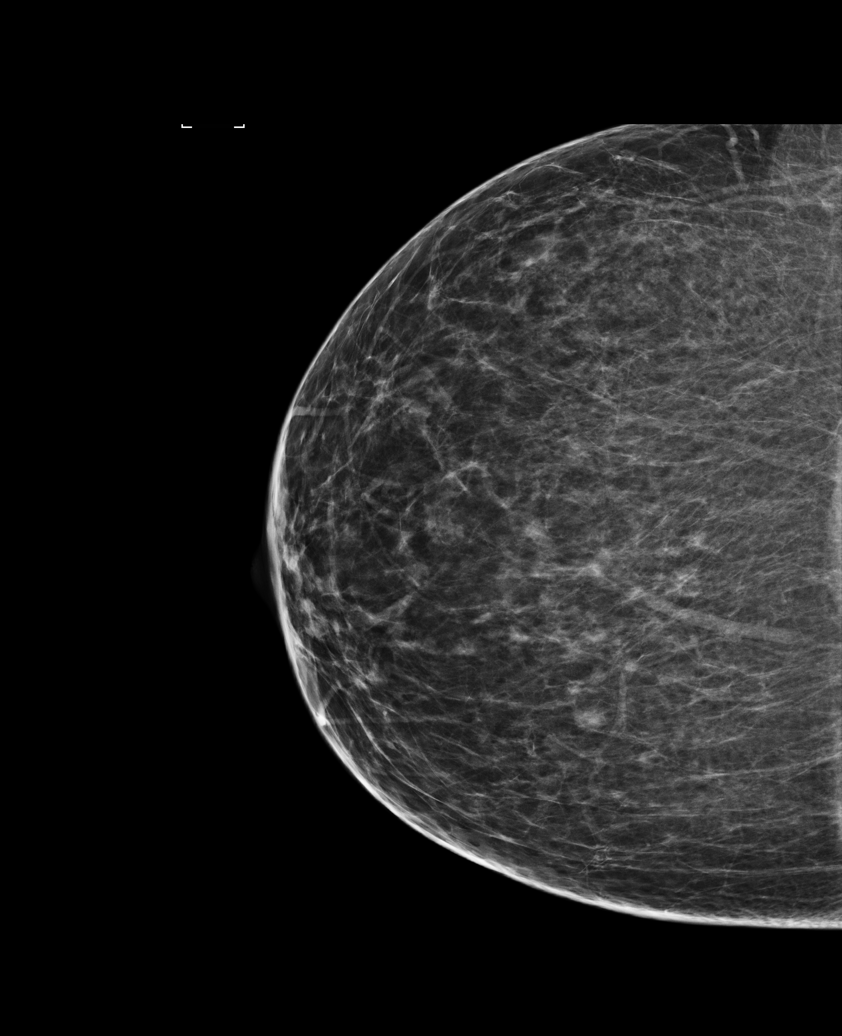

[5 of 5 positions shown; findings below may reference images not displayed]

ACR Breast Density Category b: There are scattered areas of
fibroglandular density.
FINDINGS: There are no findings suspicious for malignancy.
IMPRESSION: No mammographic evidence of malignancy. A result letter of this
screening mammogram will be mailed directly to the patient.

RECOMMENDATION:
Screening mammogram in one year. (Code:[id number]-[id number])

BI-RADS CATEGORY  1: Negative.

## 2022-03-03 ENCOUNTER — Other Ambulatory Visit: Payer: Managed Care, Other (non HMO)

## 2022-03-03 DIAGNOSIS — E559 Vitamin D deficiency, unspecified: Secondary | ICD-10-CM

## 2022-03-03 DIAGNOSIS — Z6835 Body mass index (BMI) 35.0-35.9, adult: Secondary | ICD-10-CM

## 2022-03-03 DIAGNOSIS — R5383 Other fatigue: Secondary | ICD-10-CM

## 2022-03-03 DIAGNOSIS — Z0000 Encounter for general adult medical examination without abnormal findings: Secondary | ICD-10-CM

## 2022-03-04 LAB — CBC WITH DIFFERENTIAL/PLATELET
Absolute Monocytes: 432 cells/uL (ref 200–950)
Basophils Absolute: 43 cells/uL (ref 0–200)
Basophils Relative: 0.6 %
Eosinophils Absolute: 158 cells/uL (ref 15–500)
Eosinophils Relative: 2.2 %
HCT: 36.1 % (ref 35.0–45.0)
Hemoglobin: 11.6 g/dL — ABNORMAL LOW (ref 11.7–15.5)
Lymphs Abs: 1570 cells/uL (ref 850–3900)
MCH: 26.7 pg — ABNORMAL LOW (ref 27.0–33.0)
MCHC: 32.1 g/dL (ref 32.0–36.0)
MCV: 83.2 fL (ref 80.0–100.0)
MPV: 12.7 fL — ABNORMAL HIGH (ref 7.5–12.5)
Monocytes Relative: 6 %
Neutro Abs: 4997 cells/uL (ref 1500–7800)
Neutrophils Relative %: 69.4 %
Platelets: 237 10*3/uL (ref 140–400)
RBC: 4.34 10*6/uL (ref 3.80–5.10)
RDW: 13.9 % (ref 11.0–15.0)
Total Lymphocyte: 21.8 %
WBC: 7.2 10*3/uL (ref 3.8–10.8)

## 2022-03-04 LAB — LIPID PANEL
Cholesterol: 199 mg/dL (ref <200)
HDL: 71 mg/dL (ref >=50)
LDL Cholesterol (Calc): 105 mg/dL (calc) — ABNORMAL HIGH
Non-HDL Cholesterol (Calc): 128 mg/dL (calc) (ref <130)
Total CHOL/HDL Ratio: 2.8 (calc) (ref <5.0)
Triglycerides: 135 mg/dL (ref <150)

## 2022-03-04 LAB — COMPLETE METABOLIC PANEL WITH GFR
AG Ratio: 1.4 (calc) (ref 1.0–2.5)
ALT: 16 U/L (ref 6–29)
AST: 17 U/L (ref 10–35)
Albumin: 3.9 g/dL (ref 3.6–5.1)
Alkaline phosphatase (APISO): 76 U/L (ref 37–153)
BUN: 10 mg/dL (ref 7–25)
CO2: 27 mmol/L (ref 20–32)
Calcium: 9.3 mg/dL (ref 8.6–10.4)
Chloride: 108 mmol/L (ref 98–110)
Creat: 0.92 mg/dL (ref 0.50–1.03)
Globulin: 2.8 g/dL (calc) (ref 1.9–3.7)
Glucose, Bld: 93 mg/dL (ref 65–99)
Potassium: 4.3 mmol/L (ref 3.5–5.3)
Sodium: 143 mmol/L (ref 135–146)
Total Bilirubin: 0.2 mg/dL (ref 0.2–1.2)
Total Protein: 6.7 g/dL (ref 6.1–8.1)
eGFR: 76 mL/min/{1.73_m2} (ref >=60)

## 2022-03-04 LAB — TSH: TSH: 0.99 mIU/L

## 2022-03-04 LAB — VITAMIN D 25 HYDROXY (VIT D DEFICIENCY, FRACTURES): Vit D, 25-Hydroxy: 32 ng/mL (ref 30–100)

## 2022-03-09 ENCOUNTER — Encounter: Payer: Self-pay | Admitting: Internal Medicine

## 2022-03-09 ENCOUNTER — Ambulatory Visit (INDEPENDENT_AMBULATORY_CARE_PROVIDER_SITE_OTHER): Payer: Managed Care, Other (non HMO) | Admitting: Internal Medicine

## 2022-03-09 ENCOUNTER — Other Ambulatory Visit (HOSPITAL_COMMUNITY)
Admission: RE | Admit: 2022-03-09 | Discharge: 2022-03-09 | Disposition: A | Discharge status: Home / Self Care | Payer: Managed Care, Other (non HMO) | Source: Ambulatory Visit | Attending: Internal Medicine | Admitting: Internal Medicine

## 2022-03-09 VITALS — BP 122/88 | HR 76 | Temp 98.7°F | Ht 66.0 in | Wt 225.2 lb

## 2022-03-09 DIAGNOSIS — Z8639 Personal history of other endocrine, nutritional and metabolic disease: Secondary | ICD-10-CM

## 2022-03-09 DIAGNOSIS — M170 Bilateral primary osteoarthritis of knee: Secondary | ICD-10-CM

## 2022-03-09 DIAGNOSIS — Z8669 Personal history of other diseases of the nervous system and sense organs: Secondary | ICD-10-CM | POA: Diagnosis not present

## 2022-03-09 DIAGNOSIS — Z1231 Encounter for screening mammogram for malignant neoplasm of breast: Secondary | ICD-10-CM | POA: Diagnosis not present

## 2022-03-09 DIAGNOSIS — Z6836 Body mass index (BMI) 36.0-36.9, adult: Secondary | ICD-10-CM

## 2022-03-09 DIAGNOSIS — Z0000 Encounter for general adult medical examination without abnormal findings: Secondary | ICD-10-CM

## 2022-03-09 DIAGNOSIS — Z124 Encounter for screening for malignant neoplasm of cervix: Secondary | ICD-10-CM | POA: Diagnosis not present

## 2022-03-09 DIAGNOSIS — R609 Edema, unspecified: Secondary | ICD-10-CM

## 2022-03-09 LAB — POCT URINALYSIS DIPSTICK
Bilirubin, UA: NEGATIVE
Blood, UA: NEGATIVE
Glucose, UA: NEGATIVE
Ketones, UA: NEGATIVE
Leukocytes, UA: NEGATIVE
Nitrite, UA: NEGATIVE
Protein, UA: POSITIVE — AB
Spec Grav, UA: 1.015 (ref 1.010–1.025)
Urobilinogen, UA: 0.2 E.U./dL
pH, UA: 5 (ref 5.0–8.0)

## 2022-03-09 MED ORDER — IBUPROFEN 800 MG PO TABS: 800.0000 mg | ORAL_TABLET | Freq: Three times a day (TID) | ORAL | 3 refills | Status: AC | PRN

## 2022-03-09 MED ORDER — ERGOCALCIFEROL 1.25 MG (50000 UT) PO CAPS: 50000.0000 [IU] | ORAL_CAPSULE | ORAL | 3 refills | Status: DC

## 2022-03-09 MED ORDER — FUROSEMIDE 20 MG PO TABS: 20.0000 mg | ORAL_TABLET | Freq: Every day | ORAL | 3 refills | Status: AC

## 2022-03-09 MED ORDER — BUTALBITAL-APAP-CAFFEINE 50-325-40 MG PO TABS: 1.0000 | ORAL_TABLET | Freq: Four times a day (QID) | ORAL | 2 refills | Status: DC | PRN

## 2022-03-09 NOTE — Progress Notes (Signed)
      Subjective:    Patient ID: Andrea Wagner, female    DOB: [birth year], 51 y.o.   MRN: [id number]  HPI 51 year old Female for health maintenance exam and evalaution of medical issues.  She has a history of vitamin D deficiency and migraine headaches.  Issues with dependent edema for which she takes Lasix 20 mg daily.  Takes ibuprofen 800 mg up to 3 times daily as needed for osteoarthritis of her knees.  Takes high-dose vitamin D for history of vitamin D deficiency.  Takes Fioricet as needed for migraine headaches.  Has upcoming mammogram appointment.  Had colonoscopy in July 2022.  Social history: She is married.  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  Does not smoke.  Family history: Mother with history of hip replacement and history of Guillain-Barr syndrome affecting her lower extremities and hands.  Situational stress with husband who has had multiple medical issues recently.  Review of Systems Hx migraines headaches. Takes Fioricet if needed. Starts with Excedrin and then takes Fioricet with relief.     Objective:   Physical Exam Blood pressure 122/88.  Pulse 76 and regular, temperature 98.7, weight 225 pounds 4 ounces BMI 36.36.  Pulse oximetry 98%. Skin is warm and dry.  No cervical adenopathy or thyromegaly.  TMs are clear.  Neck is supple.  No carotid bruits.  Chest clear without rales or wheezing.  Cardiac exam: Regular rate and rhythm normal, S1 and S2, without murmur appreciated today.  Abdomen is soft nondistended without hepatosplenomegaly, masses or tenderness.  Trace lower extremity edema.  GU: Pap smear taken.  Bimanual is normal.      Assessment & Plan:  History of vitamin D deficiency-level is 32.  Continue vitamin D supplement.  History of musculoskeletal pain/bilateral knee pain due to osteoarthritis treated with ibuprofen  History of migraine headaches treated with  Fioricet and stable.  Not as frequent as they used to be.  Dependent edema treated with as needed Lasix 20 mg daily  Hemoglobin 11.6 g-recommend multivitamin with iron.  MCV is normal but may not have been eating quite as well recently due to husband's illness.  BMI 36-hopefully will be able to have time to diet and exercise after her husband's medical issues have been thoroughly evaluated.  Very mildly elevated LDL of 105-continue diet and exercise efforts  Plan: Return in 1 year or as needed.

## 2022-03-09 NOTE — Patient Instructions (Addendum)
It was a pleasure to see you today.  Continue current medications including vitamin D supplement weekly.  Take Fioricet as needed for migraine headaches.  Continue Lasix 20 mg daily for dependent edema.  Potassium is stable.  May want to take multivitamin with iron his hemoglobin is slightly low at 11.6 g but may not have been eating quite as well with husband's illness.  Continue diet and exercise efforts as time permits.  Follow-up in 1 year or as needed.

## 2022-03-13 ENCOUNTER — Other Ambulatory Visit: Payer: Self-pay

## 2022-03-13 LAB — CYTOLOGY - PAP
Comment: NEGATIVE
Diagnosis: NEGATIVE
High risk HPV: NEGATIVE

## 2022-03-13 MED ORDER — METRONIDAZOLE 500 MG PO TABS: 500.0000 mg | ORAL_TABLET | Freq: Two times a day (BID) | ORAL | 0 refills | Status: AC

## 2022-03-14 ENCOUNTER — Other Ambulatory Visit: Payer: Managed Care, Other (non HMO)

## 2022-03-14 DIAGNOSIS — R809 Proteinuria, unspecified: Secondary | ICD-10-CM

## 2022-03-15 ENCOUNTER — Ambulatory Visit
Admission: RE | Admit: 2022-03-15 | Discharge: 2022-03-15 | Disposition: A | Discharge status: Home / Self Care | Payer: Managed Care, Other (non HMO) | Source: Ambulatory Visit | Attending: Internal Medicine | Admitting: Internal Medicine

## 2022-03-15 DIAGNOSIS — Z1231 Encounter for screening mammogram for malignant neoplasm of breast: Secondary | ICD-10-CM

## 2022-03-15 LAB — MICROALBUMIN, URINE: Microalb, Ur: 31.3 mg/dL

## 2023-03-06 ENCOUNTER — Other Ambulatory Visit: Payer: Managed Care, Other (non HMO)

## 2023-03-06 DIAGNOSIS — Z13220 Encounter for screening for lipoid disorders: Secondary | ICD-10-CM

## 2023-03-06 DIAGNOSIS — E559 Vitamin D deficiency, unspecified: Secondary | ICD-10-CM

## 2023-03-06 DIAGNOSIS — Z1329 Encounter for screening for other suspected endocrine disorder: Secondary | ICD-10-CM

## 2023-03-06 DIAGNOSIS — R609 Edema, unspecified: Secondary | ICD-10-CM

## 2023-03-06 DIAGNOSIS — Z0000 Encounter for general adult medical examination without abnormal findings: Secondary | ICD-10-CM

## 2023-03-06 NOTE — Progress Notes (Signed)
Patient Care Team: Margaree Mackintosh, MD as PCP - General  Visit Date: 03/13/23  Subjective:    Patient ID: Andrea Wagner , Female   DOB: 21-Sep-1970, 51 y.o.    MRN: [id number]   52 y.o. Female presents today for a comprehensive physical exam.  Reports feeling well regarding general health.  History of dependent edema treated with furosemide 20 mg daily.H  History of Vitamin D deficiency treated with Vitamin D 50,000 units weekly. Requesting refill. Vitamin D low at 28. Started taking Vitamin D supplement recently after seeing these results.  History of migraine headaches treated with Fioricet 50-325-40 mg every 6 hours as needed. Requesting refill.  Takes ibuprofen 800 mg up to 3 times daily as needed for osteoarthritis of her knees.    Glucose normal. Kidney, liver functions normal. Electrolytes normal. Blood proteins normal. MPV elevated at 12.6. Lipid panel normal. TSH at 0.75.   Pap smear last completed 03/09/22. Negative for intraepithelial lesion or malignancy. Recommended repeat in 2026.  Mammogram last completed 03/16/22. No mammographic evidence of malignancy. Recommended repeat in 2024.  Had colonoscopy in July 2022.  Social history: She is married.  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  Does not smoke.  Family history: Mother with history of hip replacement and history of Guillain-Barr syndrome affecting her lower extremities and hands.  Past Medical History:  Diagnosis Date   Migraine    Pyelonephritis      Family History  Problem Relation Age of Onset   Hypertension Mother    Diabetes Mother    Diabetes Father    Heart disease Father     Social history: She is married.  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  She has a Masters degree and helps with managerial duties in addition to nursing duties.  She does not smoke.  Family history:  Mother with history of hip arthroplasty.  Mother also has history in the past of Guillain-Barr syndrome which is affected her lower extremities and hands.    Review of Systems  Constitutional:  Negative for chills, fever, malaise/fatigue and weight loss.  HENT:  Negative for hearing loss, sinus pain and sore throat.   Respiratory:  Negative for cough, hemoptysis and shortness of breath.   Cardiovascular:  Negative for chest pain, palpitations, leg swelling and PND.  Gastrointestinal:  Negative for abdominal pain, constipation, diarrhea, heartburn, nausea and vomiting.  Genitourinary:  Negative for dysuria, frequency and urgency.  Musculoskeletal:  Negative for back pain, myalgias and neck pain.  Skin:  Negative for itching and rash.  Neurological:  Negative for dizziness, tingling, seizures and headaches.  Endo/Heme/Allergies:  Negative for polydipsia.  Psychiatric/Behavioral:  Negative for depression. The patient is not nervous/anxious.         Objective:   Vitals: BP 122/82   Pulse 80   Resp 16   Ht 5' 5.5" (1.664 m)   Wt 226 lb (102.5 kg)   LMP 11/11/2022 (Approximate)   SpO2 98%   BMI 37.04 kg/m    Physical Exam Vitals and nursing note reviewed.  Constitutional:      General: She is not in acute distress.    Appearance: Normal appearance. She is not ill-appearing or toxic-appearing.  HENT:     Head: Normocephalic and atraumatic.     Right Ear: Hearing, tympanic membrane, ear canal and external ear  normal.     Left Ear: Hearing, tympanic membrane, ear canal and external ear normal.     Mouth/Throat:     Pharynx: Oropharynx is clear.  Eyes:     Extraocular Movements: Extraocular movements intact.     Pupils: Pupils are equal, round, and reactive to light.  Neck:     Thyroid: No thyroid mass, thyromegaly or thyroid tenderness.     Vascular: No carotid bruit.  Cardiovascular:     Rate and Rhythm: Normal rate and regular rhythm. No extrasystoles are present.     Pulses:          Dorsalis pedis pulses are 1+ on the right side and 1+ on the left side.     Heart sounds: Normal heart sounds. No murmur heard.    No friction rub. No gallop.  Pulmonary:     Effort: Pulmonary effort is normal.     Breath sounds: Normal breath sounds. No decreased breath sounds, wheezing, rhonchi or rales.  Chest:     Chest wall: No mass.  Abdominal:     Palpations: Abdomen is soft. There is no hepatomegaly, splenomegaly or mass.     Tenderness: There is no abdominal tenderness.     Hernia: No hernia is present.  Genitourinary:    Comments: Bimanual exam normal. Musculoskeletal:     Cervical back: Normal range of motion.     Right lower leg: No edema.     Left lower leg: No edema.  Lymphadenopathy:     Cervical: No cervical adenopathy.     Upper Body:     Right upper body: No supraclavicular adenopathy.     Left upper body: No supraclavicular adenopathy.  Skin:    General: Skin is warm and dry.  Neurological:     General: No focal deficit present.     Mental Status: She is alert and oriented to person, place, and time. Mental status is at baseline.     Sensory: Sensation is intact.     Motor: Motor function is intact. No weakness.     Deep Tendon Reflexes: Reflexes are normal and symmetric.  Psychiatric:        Attention and Perception: Attention normal.        Mood and Affect: Mood normal.        Speech: Speech normal.        Behavior: Behavior normal.        Thought Content: Thought content normal.        Cognition and Memory: Cognition normal.        Judgment: Judgment normal.       Results:   Studies obtained and personally reviewed by me:  Pap smear last completed 03/09/22. Negative for intraepithelial lesion or malignancy. Recommended repeat in 2026.  Mammogram last completed 03/16/22. No mammographic evidence of malignancy. Recommended repeat in 2024.  Labs:       Component Value Date/Time   NA 144 03/06/2023 0911   K 4.8 03/06/2023 0911    CL 107 03/06/2023 0911   CO2 29 03/06/2023 0911   GLUCOSE 95 03/06/2023 0911   BUN 12 03/06/2023 0911   CREATININE 0.75 03/06/2023 0911   CALCIUM 9.5 03/06/2023 0911   PROT 6.4 03/06/2023 0911   ALBUMIN 3.7 11/27/2016 0411   AST 13 03/06/2023 0911   ALT 13 03/06/2023 0911   ALKPHOS 83 11/27/2016 0411   BILITOT 0.3 03/06/2023 0911   GFRNONAA 84 11/11/2020 1205   GFRAA 97 11/11/2020 1205  Lab Results  Component Value Date   WBC 6.7 03/06/2023   HGB 12.0 03/06/2023   HCT 36.5 03/06/2023   MCV 83.5 03/06/2023   PLT 235 03/06/2023    Lab Results  Component Value Date   CHOL 179 03/06/2023   HDL 74 03/06/2023   LDLCALC 86 03/06/2023   TRIG 95 03/06/2023   CHOLHDL 2.4 03/06/2023    No results found for: "HGBA1C"   Lab Results  Component Value Date   TSH 0.75 03/06/2023      Assessment & Plan:   Dependent edema: treated with furosemide 20 mg daily.  Vitamin D deficiency: treated with Vitamin D 50,000 units weekly. Refilled. Vitamin D low at 28.   Migraine headaches: treated with Fioricet 50-325-40 mg every 6 hours as needed. Refilled.  Osteoarthritis knees: treated with ibuprofen 800 mg up to 3 times daily.    Pap smear last completed 03/09/22. Negative for intraepithelial lesion or malignancy. Recommended repeat in 2026.  Mammogram last completed 03/16/22. No mammographic evidence of malignancy. Recommended repeat in 2024.  Had colonoscopy in July 2022.  Vaccine counseling: UTD on tetanus vaccine.  Return in 1 year for health maintenance exam or as needed.    I,Alexander Ruley,acting as a Neurosurgeon for Margaree Mackintosh, MD.,have documented all relevant documentation on the behalf of Margaree Mackintosh, MD,as directed by  Margaree Mackintosh, MD while in the presence of Margaree Mackintosh, MD.   I, Margaree Mackintosh, MD, have reviewed all documentation for this visit. The documentation on 03/31/23 for the exam, diagnosis, procedures, and orders are all accurate and complete.

## 2023-03-07 LAB — TSH: TSH: 0.75 mIU/L

## 2023-03-07 LAB — CBC WITH DIFFERENTIAL/PLATELET
Absolute Monocytes: 610 cells/uL (ref 200–950)
Basophils Absolute: 27 cells/uL (ref 0–200)
Basophils Relative: 0.4 %
Eosinophils Absolute: 221 cells/uL (ref 15–500)
Eosinophils Relative: 3.3 %
HCT: 36.5 % (ref 35.0–45.0)
Hemoglobin: 12 g/dL (ref 11.7–15.5)
Lymphs Abs: 1367 cells/uL (ref 850–3900)
MCH: 27.5 pg (ref 27.0–33.0)
MCHC: 32.9 g/dL (ref 32.0–36.0)
MCV: 83.5 fL (ref 80.0–100.0)
MPV: 12.6 fL — ABNORMAL HIGH (ref 7.5–12.5)
Monocytes Relative: 9.1 %
Neutro Abs: 4476 cells/uL (ref 1500–7800)
Neutrophils Relative %: 66.8 %
Platelets: 235 10*3/uL (ref 140–400)
RBC: 4.37 10*6/uL (ref 3.80–5.10)
RDW: 13.5 % (ref 11.0–15.0)
Total Lymphocyte: 20.4 %
WBC: 6.7 10*3/uL (ref 3.8–10.8)

## 2023-03-07 LAB — COMPLETE METABOLIC PANEL WITHOUT GFR
AG Ratio: 1.7 (calc) (ref 1.0–2.5)
ALT: 13 U/L (ref 6–29)
AST: 13 U/L (ref 10–35)
Albumin: 4 g/dL (ref 3.6–5.1)
Alkaline phosphatase (APISO): 73 U/L (ref 37–153)
BUN: 12 mg/dL (ref 7–25)
CO2: 29 mmol/L (ref 20–32)
Calcium: 9.5 mg/dL (ref 8.6–10.4)
Chloride: 107 mmol/L (ref 98–110)
Creat: 0.75 mg/dL (ref 0.50–1.03)
Globulin: 2.4 g/dL (ref 1.9–3.7)
Glucose, Bld: 95 mg/dL (ref 65–99)
Potassium: 4.8 mmol/L (ref 3.5–5.3)
Sodium: 144 mmol/L (ref 135–146)
Total Bilirubin: 0.3 mg/dL (ref 0.2–1.2)
Total Protein: 6.4 g/dL (ref 6.1–8.1)
eGFR: 96 mL/min/{1.73_m2}

## 2023-03-07 LAB — LIPID PANEL
Cholesterol: 179 mg/dL
HDL: 74 mg/dL
LDL Cholesterol (Calc): 86 mg/dL
Non-HDL Cholesterol (Calc): 105 mg/dL
Total CHOL/HDL Ratio: 2.4 (calc)
Triglycerides: 95 mg/dL

## 2023-03-07 LAB — VITAMIN D 25 HYDROXY (VIT D DEFICIENCY, FRACTURES): Vit D, 25-Hydroxy: 28 ng/mL — ABNORMAL LOW (ref 30–100)

## 2023-03-13 ENCOUNTER — Ambulatory Visit: Payer: Managed Care, Other (non HMO) | Admitting: Internal Medicine

## 2023-03-13 VITALS — BP 122/82 | HR 80 | Resp 16 | Ht 65.5 in | Wt 226.0 lb

## 2023-03-13 DIAGNOSIS — R609 Edema, unspecified: Secondary | ICD-10-CM | POA: Diagnosis not present

## 2023-03-13 DIAGNOSIS — Z8669 Personal history of other diseases of the nervous system and sense organs: Secondary | ICD-10-CM

## 2023-03-13 DIAGNOSIS — Z8639 Personal history of other endocrine, nutritional and metabolic disease: Secondary | ICD-10-CM

## 2023-03-13 DIAGNOSIS — Z0000 Encounter for general adult medical examination without abnormal findings: Secondary | ICD-10-CM | POA: Diagnosis not present

## 2023-03-13 DIAGNOSIS — Z6837 Body mass index (BMI) 37.0-37.9, adult: Secondary | ICD-10-CM

## 2023-03-13 DIAGNOSIS — M170 Bilateral primary osteoarthritis of knee: Secondary | ICD-10-CM

## 2023-03-13 LAB — POCT URINALYSIS DIPSTICK
Bilirubin, UA: NEGATIVE
Blood, UA: NEGATIVE
Glucose, UA: NEGATIVE
Ketones, UA: NEGATIVE
Leukocytes, UA: NEGATIVE
Nitrite, UA: NEGATIVE
Protein, UA: NEGATIVE
Spec Grav, UA: 1.015 (ref 1.010–1.025)
Urobilinogen, UA: 0.2 E.U./dL
pH, UA: 5 (ref 5.0–8.0)

## 2023-03-31 ENCOUNTER — Encounter: Payer: Self-pay | Admitting: Internal Medicine

## 2023-03-31 NOTE — Patient Instructions (Signed)
Patient has history of migraine headaches and Fioricet has been refilled to take as needed.  Refilled high-dose vitamin D 50,000 units weekly.  Vitamin D level is low at 28.  Continue furosemide 20 mg daily for dependent edema.  Pap not needed until 2026.  Please have repeat colonoscopy this year.  Up-to-date on tetanus vaccine.  Colonoscopy is up-to-date.  It was a pleasure to see you today.  Return in 1 year or as needed.

## 2023-06-11 ENCOUNTER — Other Ambulatory Visit: Payer: Self-pay | Admitting: Internal Medicine

## 2023-06-11 DIAGNOSIS — Z1231 Encounter for screening mammogram for malignant neoplasm of breast: Secondary | ICD-10-CM

## 2023-06-12 ENCOUNTER — Ambulatory Visit
Admission: RE | Admit: 2023-06-12 | Discharge: 2023-06-12 | Disposition: A | Discharge status: Home / Self Care | Payer: Managed Care, Other (non HMO) | Source: Ambulatory Visit

## 2023-06-12 DIAGNOSIS — Z1231 Encounter for screening mammogram for malignant neoplasm of breast: Secondary | ICD-10-CM

## 2023-06-21 ENCOUNTER — Other Ambulatory Visit: Payer: Self-pay | Admitting: Internal Medicine

## 2023-06-21 MED ORDER — ERGOCALCIFEROL 1.25 MG (50000 UT) PO CAPS: 50000.0000 [IU] | ORAL_CAPSULE | ORAL | 3 refills | Status: AC

## 2023-06-21 MED ORDER — ERGOCALCIFEROL 1.25 MG (50000 UT) PO CAPS: 50000.0000 [IU] | ORAL_CAPSULE | ORAL | 3 refills | Status: DC

## 2023-06-21 MED ORDER — BUTALBITAL-APAP-CAFFEINE 50-325-40 MG PO TABS: 1.0000 | ORAL_TABLET | Freq: Four times a day (QID) | ORAL | 2 refills | Status: DC | PRN

## 2023-06-21 MED ORDER — BUTALBITAL-APAP-CAFFEINE 50-325-40 MG PO TABS
1.0000 | ORAL_TABLET | Freq: Four times a day (QID) | ORAL | 2 refills | Status: DC | PRN
Start: 2023-06-21 — End: 2024-03-13

## 2023-06-21 NOTE — Telephone Encounter (Signed)
Refill Xanax and Fioricet as requested. MJB, MD

## 2023-06-21 NOTE — Telephone Encounter (Signed)
Andrea Wagner [phone]--[phone]  [name] called wanting refills on below medications.  butalbital-acetaminophen-caffeine (FIORICET) 50-325-40 MG tablet   ergocalciferol (VITAMIN D2) 1.25 MG (50000 UT) capsule   [id number] [place], [place] - [address] Phone: [phone]  Fax: [phone]

## 2023-06-21 NOTE — Telephone Encounter (Signed)
Please sign fioricet, vit d has been sent.

## 2023-06-21 NOTE — Addendum Note (Signed)
Addended by: Gregery Na on: 06/21/2023 02:56 PM   Modules accepted: Orders

## 2023-12-17 ENCOUNTER — Telehealth: Payer: Self-pay | Admitting: *Deleted

## 2023-12-17 DIAGNOSIS — N921 Excessive and frequent menstruation with irregular cycle: Secondary | ICD-10-CM

## 2023-12-17 NOTE — Telephone Encounter (Signed)
 Copied from CRM [id number]. Topic: Referral - Request for Referral >> Dec 17, 2023 11:05 AM Georgeann Kindred wrote: Did the patient discuss referral with their provider in the last year? No (If No - schedule appointment) (If Yes - send message)  Appointment offered? Yes  Type of order/referral and detailed reason for visit: ObGyn  Preference of office, provider, location: [facility]  If referral order, have you been seen by this specialty before? Yes (If Yes, this issue or another issue? When? Where? 2021  Can we respond through MyChart? Yes

## 2023-12-18 ENCOUNTER — Encounter: Payer: Self-pay | Admitting: Internal Medicine

## 2023-12-18 ENCOUNTER — Telehealth: Payer: Self-pay | Admitting: Internal Medicine

## 2023-12-18 MED ORDER — NORETHINDRONE 0.35 MG PO TABS: 1.0000 | ORAL_TABLET | Freq: Every day | ORAL | 1 refills | Status: DC

## 2023-12-18 NOTE — Telephone Encounter (Signed)
 Patient is having menorrhagia. 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 She tells me she last saw a GYN, Dr. Onnie Bilis with [facility] which at that time was on [facility]. Now is at [address] and Dr. Willey Harrier is still there according to the computer. Would Sheketia please put in a referral there for menorrhagia for this patient? I am going to start her on Micronor and see her next Thursday as it may some time to get new GYN appt.

## 2023-12-18 NOTE — Addendum Note (Signed)
 Addended by: Marylou Sobers D on: 12/18/2023 11:27 AM   Modules accepted: Orders

## 2023-12-18 NOTE — Telephone Encounter (Signed)
 Referral placed.

## 2023-12-27 ENCOUNTER — Ambulatory Visit (INDEPENDENT_AMBULATORY_CARE_PROVIDER_SITE_OTHER): Admitting: Internal Medicine

## 2023-12-27 VITALS — BP 122/84 | HR 72 | Temp 96.1°F | Ht 66.0 in | Wt 221.1 lb

## 2023-12-27 DIAGNOSIS — N920 Excessive and frequent menstruation with regular cycle: Secondary | ICD-10-CM | POA: Insufficient documentation

## 2023-12-27 DIAGNOSIS — N924 Excessive bleeding in the premenopausal period: Secondary | ICD-10-CM | POA: Diagnosis not present

## 2023-12-27 NOTE — Progress Notes (Signed)
 Patient Care Team: Andrea Evener, MD as PCP - General  Visit Date: 12/27/23  Subjective:   Chief Complaint  Patient presents with   Vaginal Bleeding    Patient states medication that was sent in helped.  Patient WU:[name], Andrea Wagner DOB:04/15/1971,53 y.o. NWG:[id number]   53 y.o. Female presents today for acute visit with Menorrhagia. On 12/17/2023 she reported that she had been having heavy bleeding and needed a referral to GYN, Dr. Onnie Bilis w/ [facility], which was placed and she was started on Micronor . Today she says that she is peri-menopausal and previously her cycle was irregular before, but she has only had bleeding this heavy once since becoming peri-menopausal and this time around the bleeding stopped on Sunday after she had started taking the Micronor .   Health Maintenance: Postponed Hepatits C & HIV Screening; Postponed Covid-19 vaccine; Discussed Shingles vaccination. 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 Married with adult children.Nonsmoker.  Review of Systems  Genitourinary:        (+) Heavy Vaginal Bleeding  All other systems reviewed and are negative.    Objective:  Vitals: BP 122/84   Pulse 72   Temp (!) 96.1 F (35.6 C) (Temporal)   Ht 5\' 6"  (1.676 m)   Wt 221 lb 1.9 oz (100.3 kg)   SpO2 96%   BMI 35.69 kg/m   Physical Exam Vitals and nursing note reviewed.  Constitutional:      General: She is not in acute distress.    Appearance: Normal appearance. She is not toxic-appearing.  HENT:     Head: Normocephalic and atraumatic.  Pulmonary:     Effort: Pulmonary effort is normal.  Skin:    General:  Skin is warm and dry.  Neurological:     Mental Status: She is alert and oriented to person, place, and time. Mental status is at baseline.  Psychiatric:        Mood and Affect: Mood normal.        Behavior: Behavior normal.        Thought Content: Thought content normal.        Judgment: Judgment normal.     Results:  Studies Obtained And Personally Reviewed By Me: Labs:     Component Value Date/Time   NA 144 03/06/2023 0911   K 4.8 03/06/2023 0911   CL 107 03/06/2023 0911   CO2 29 03/06/2023 0911   GLUCOSE 95 03/06/2023 0911   BUN 12 03/06/2023 0911   CREATININE 0.75 03/06/2023 0911   CALCIUM 9.5 03/06/2023 0911   PROT 6.4 03/06/2023 0911   ALBUMIN 3.7 11/27/2016 0411   AST 13 03/06/2023 0911   ALT 13 03/06/2023 0911   ALKPHOS 83 11/27/2016 0411   BILITOT 0.3 03/06/2023 0911   GFRNONAA 84 11/11/2020 1205   GFRAA 97 11/11/2020 1205    Lab Results  Component Value Date   WBC 6.7 03/06/2023   HGB 12.0 03/06/2023   HCT 36.5 03/06/2023   MCV 83.5 03/06/2023   PLT 235 03/06/2023   Lab Results  Component Value Date   CHOL 179  03/06/2023   HDL 74 03/06/2023   LDLCALC 86 03/06/2023   TRIG 95 03/06/2023   CHOLHDL 2.4 03/06/2023   Lab Results  Component Value Date   TSH 0.75 03/06/2023    Assessment & Plan:  Menorrhagia: that she had been having heavy bleeding and needed a referral to GYN, Dr. Onnie Bilis w/ [facility], which was placed and  on April 15th ,she was started on Micronor . Today she says that the bleeding stopped on the  Sunday after she had started taking the Micronor . She is pleased with Micronor  and can stay on it for now.Has a refill. Has appt with GYN scheduled for June at [facility]. Ok to refill Micronor  until then.   Health Maintenance: Has CPE scheduled here for July 2025.    I,Emily Lagle,acting as a Neurosurgeon for Andrea Evener, MD.,have documented all relevant documentation on the behalf of Andrea Evener, MD,as  directed by  Andrea Evener, MD while in the presence of Andrea Evener, MD.   I, Andrea Evener, MD, have reviewed all documentation for this visit. The documentation on 12/27/23 for the exam, diagnosis, procedures, and orders are all accurate and complete.

## 2023-12-27 NOTE — Patient Instructions (Addendum)
 Has responded well to Micronor . She may continue this. Has refill for now. We spoke with [facility] today with patient present and she has appt there June 12. Has CPE scheduled here for July.

## 2024-01-07 ENCOUNTER — Other Ambulatory Visit: Payer: Self-pay | Admitting: Internal Medicine

## 2024-02-14 ENCOUNTER — Encounter: Payer: Self-pay | Admitting: Obstetrics and Gynecology

## 2024-02-14 ENCOUNTER — Ambulatory Visit: Admitting: Obstetrics and Gynecology

## 2024-02-14 VITALS — BP 129/78 | HR 73 | Ht 66.0 in | Wt 221.8 lb

## 2024-02-14 DIAGNOSIS — N939 Abnormal uterine and vaginal bleeding, unspecified: Secondary | ICD-10-CM

## 2024-02-14 MED ORDER — NORETHINDRONE 0.35 MG PO TABS: 1.0000 | ORAL_TABLET | Freq: Every day | ORAL | 4 refills | Status: DC

## 2024-02-14 NOTE — Progress Notes (Signed)
 NEW GYNECOLOGY PATIENT Patient name: Andrea Wagner MRN [id number]  Date of birth: 02-Jun-1971 Chief Complaint:   Menorrhagia (Problem started a year ago, Spotting was this weekend, last about 3-4 days and then regular cycle, and often ties its just spotting or a regular period. Some cycles are heavy. Was put on medication norethindrone  to help stop bleeding in April 2025)     History:  Andrea Wagner is a 53 y.o. G2P2002 being seen today for AUB. Typically has 4 day long menses. Initially seen for some heavy bleeding, was told she was perimenopase. Had started having spotting instead of a period and then had some heavy bleeding. Noted that with the heavy bleeding and would soak through a super tampon and pad. 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   Has already had a TL. No new pain with the bleeding. Notes longstanding irregularity. Intiially after TL it may be heavy with clotting and pain and those periods stopped and primarily. No new medications. Trying to lose weight, feels has hit a plateau. Hair has thinning for a long time (since 30s), no changes in its texture. No hot flahses, ty pically feels cold and needs sweatshirt; will feel warmer during her cycle but no true hot flashes. No breat or nipple changes. Started on micronor  and bleeding has improved.  Works as a Charity fundraiser in the endoscopy/colonoscopy center  Not adverse to use of hormones (recently discussed the newest evidence for HRT with the doc she works with)      Gynecologic History No LMP recorded. Patient is perimenopausal. Contraception: tubal ligation Last Pap:     Component Value Date/Time   DIAGPAP  03/09/2022 1133    - Negative for intraepithelial lesion or malignancy (NILM)   DIAGPAP  11/08/2018 0000    NEGATIVE FOR INTRAEPITHELIAL LESIONS OR MALIGNANCY.   HPVHIGH Negative  03/09/2022 1133   ADEQPAP  03/09/2022 1133    Satisfactory for evaluation; transformation zone component PRESENT.   ADEQPAP  11/08/2018 0000    Satisfactory for evaluation  endocervical/transformation zone component PRESENT.   Last Mammogram: 06/2023 Birads 1 Last Colonoscopy: 2022  Obstetric History OB History  Gravida Para Term Preterm AB Living  2 2 2   2   SAB IAB Ectopic Multiple Live Births      2    # Outcome Date GA Lbr Len/2nd Weight Sex Type Anes PTL Lv  2 Term      Vag-Spont     1 Term      Vag-Spont       Past Medical History:  Diagnosis Date   Migraine    Pyelonephritis     Past Surgical History:  Procedure Laterality Date   TUBAL LIGATION     WISDOM TOOTH EXTRACTION      Current Outpatient Medications on File Prior to Visit  Medication Sig Dispense Refill   butalbital -acetaminophen -caffeine  (FIORICET) 50-325-40 MG tablet Take 1 tablet by mouth every 6 (six) hours as needed for headache. 30 tablet 2   ergocalciferol  (VITAMIN D2) 1.25 MG (50000 UT) capsule Take 1 capsule (50,000 Units total) by mouth once a week. 12 capsule 3   furosemide  (LASIX ) 20 MG tablet Take 1 tablet (20 mg total) by mouth daily. 90 tablet 3   ibuprofen  (ADVIL ) 800 MG tablet Take 1 tablet (800 mg total) by mouth every 8 (eight)  hours as needed. 90 tablet 3   norethindrone  (MICRONOR ) 0.35 MG tablet TAKE 1 TABLET BY MOUTH EVERY DAY 84 tablet 1   No current facility-administered medications on file prior to visit.    Allergies  Allergen Reactions   Sulfa Antibiotics Swelling    Social History:  reports that she has never smoked. She has never used smokeless tobacco. She reports current alcohol use. She reports that she does not use drugs.  Family History  Problem Relation Age of Onset   Hypertension Mother    Diabetes Mother    Diabetes Father    Heart disease Father     The following portions of the patient's history were reviewed and updated as appropriate: allergies, current  medications, past family history, past medical history, past social history, past surgical history and problem list.  Review of Systems Pertinent items noted in HPI and remainder of comprehensive ROS otherwise negative.  Physical Exam:  BP 129/78 (BP Location: Right Arm, Patient Position: Sitting, Cuff Size: Large)   Pulse 73   Ht 5' 6 (1.676 m)   Wt 221 lb 12.8 oz (100.6 kg)   SpO2 97%   BMI 35.80 kg/m  Physical Exam Vitals and nursing note reviewed.  Constitutional:      Appearance: Normal appearance.   Cardiovascular:     Rate and Rhythm: Normal rate.  Pulmonary:     Effort: Pulmonary effort is normal.     Breath sounds: Normal breath sounds.   Neurological:     General: No focal deficit present.     Mental Status: She is alert and oriented to person, place, and time.   Psychiatric:        Mood and Affect: Mood normal.        Behavior: Behavior normal.        Thought Content: Thought content normal.        Judgment: Judgment normal.      Assessment and Plan:   1. Abnormal uterine bleeding (AUB) (Primary) Patient has abnormal uterine bleeding. Will order abnormal uterine bleeding evaluation labs and pelvic ultrasound to evaluate for any structural gynecologic abnormalities.  Will follow up patient with these results and plans for further evaluation/management. Noted that menstrual changes likely due to perimenopause and currently well managed with micronor .  - US  PELVIC COMPLETE WITH TRANSVAGINAL; Future - CBC - Follicle stimulating hormone - norethindrone  (MICRONOR ) 0.35 MG tablet; Take 1 tablet (0.35 mg total) by mouth daily.  Dispense: 84 tablet; Refill: 4   Routine preventative health maintenance measures emphasized. Please refer to After Visit Summary for other counseling recommendations.   Follow-up: No follow-ups on file.      Kiki Pelton, MD Obstetrician & Gynecologist, Faculty Practice [facility] for [employer], [facility]

## 2024-02-15 LAB — CBC
Hematocrit: 37.4 % (ref 34.0–46.6)
Hemoglobin: 11.8 g/dL (ref 11.1–15.9)
MCH: 27.6 pg (ref 26.6–33.0)
MCHC: 31.6 g/dL (ref 31.5–35.7)
MCV: 87 fL (ref 79–97)
Platelets: 277 10*3/uL (ref 150–450)
RBC: 4.28 x10E6/uL (ref 3.77–5.28)
RDW: 12.5 % (ref 11.7–15.4)
WBC: 8.8 10*3/uL (ref 3.4–10.8)

## 2024-02-15 LAB — FOLLICLE STIMULATING HORMONE: FSH: 5.6 m[IU]/mL

## 2024-02-18 ENCOUNTER — Ambulatory Visit: Payer: Self-pay | Admitting: Obstetrics and Gynecology

## 2024-02-26 ENCOUNTER — Ambulatory Visit (HOSPITAL_COMMUNITY)

## 2024-03-04 ENCOUNTER — Other Ambulatory Visit

## 2024-03-04 DIAGNOSIS — Z0000 Encounter for general adult medical examination without abnormal findings: Secondary | ICD-10-CM

## 2024-03-04 DIAGNOSIS — Z13220 Encounter for screening for lipoid disorders: Secondary | ICD-10-CM

## 2024-03-04 DIAGNOSIS — Z1329 Encounter for screening for other suspected endocrine disorder: Secondary | ICD-10-CM

## 2024-03-05 LAB — CBC WITH DIFFERENTIAL/PLATELET
Absolute Lymphocytes: 1453 {cells}/uL (ref 850–3900)
Absolute Monocytes: 499 {cells}/uL (ref 200–950)
Basophils Absolute: 38 {cells}/uL (ref 0–200)
Basophils Relative: 0.6 %
Eosinophils Absolute: 198 {cells}/uL (ref 15–500)
Eosinophils Relative: 3.1 %
HCT: 33.3 % — ABNORMAL LOW (ref 35.0–45.0)
Hemoglobin: 10.3 g/dL — ABNORMAL LOW (ref 11.7–15.5)
MCH: 26.8 pg — ABNORMAL LOW (ref 27.0–33.0)
MCHC: 30.9 g/dL — ABNORMAL LOW (ref 32.0–36.0)
MCV: 86.5 fL (ref 80.0–100.0)
MPV: 12.6 fL — ABNORMAL HIGH (ref 7.5–12.5)
Monocytes Relative: 7.8 %
Neutro Abs: 4211 {cells}/uL (ref 1500–7800)
Neutrophils Relative %: 65.8 %
Platelets: 244 10*3/uL (ref 140–400)
RBC: 3.85 10*6/uL (ref 3.80–5.10)
RDW: 12.3 % (ref 11.0–15.0)
Total Lymphocyte: 22.7 %
WBC: 6.4 10*3/uL (ref 3.8–10.8)

## 2024-03-05 LAB — COMPLETE METABOLIC PANEL WITHOUT GFR
AG Ratio: 1.9 (calc) (ref 1.0–2.5)
ALT: 10 U/L (ref 6–29)
AST: 13 U/L (ref 10–35)
Albumin: 3.9 g/dL (ref 3.6–5.1)
Alkaline phosphatase (APISO): 60 U/L (ref 37–153)
BUN: 9 mg/dL (ref 7–25)
CO2: 28 mmol/L (ref 20–32)
Calcium: 9 mg/dL (ref 8.6–10.4)
Chloride: 108 mmol/L (ref 98–110)
Creat: 0.81 mg/dL (ref 0.50–1.03)
Globulin: 2.1 g/dL (ref 1.9–3.7)
Glucose, Bld: 92 mg/dL (ref 65–99)
Potassium: 4.1 mmol/L (ref 3.5–5.3)
Sodium: 142 mmol/L (ref 135–146)
Total Bilirubin: 0.3 mg/dL (ref 0.2–1.2)
Total Protein: 6 g/dL — ABNORMAL LOW (ref 6.1–8.1)

## 2024-03-05 LAB — LIPID PANEL
Cholesterol: 159 mg/dL (ref <200)
HDL: 70 mg/dL (ref >=50)
LDL Cholesterol (Calc): 74 mg/dL
Non-HDL Cholesterol (Calc): 89 mg/dL (ref <130)
Total CHOL/HDL Ratio: 2.3 (calc) (ref <5.0)
Triglycerides: 71 mg/dL (ref <150)

## 2024-03-05 LAB — TSH: TSH: 0.72 m[IU]/L

## 2024-03-08 ENCOUNTER — Ambulatory Visit: Payer: Self-pay | Admitting: Internal Medicine

## 2024-03-10 ENCOUNTER — Other Ambulatory Visit: Payer: Managed Care, Other (non HMO)

## 2024-03-10 ENCOUNTER — Ambulatory Visit (HOSPITAL_COMMUNITY)
Admission: RE | Admit: 2024-03-10 | Discharge: 2024-03-10 | Disposition: A | Discharge status: Home / Self Care | Source: Ambulatory Visit | Attending: Obstetrics and Gynecology | Admitting: Obstetrics and Gynecology

## 2024-03-10 DIAGNOSIS — N939 Abnormal uterine and vaginal bleeding, unspecified: Secondary | ICD-10-CM | POA: Insufficient documentation

## 2024-03-13 ENCOUNTER — Ambulatory Visit: Payer: Managed Care, Other (non HMO) | Admitting: Internal Medicine

## 2024-03-13 ENCOUNTER — Encounter: Payer: Self-pay | Admitting: Internal Medicine

## 2024-03-13 VITALS — BP 118/80 | HR 78 | Ht 66.0 in | Wt 219.0 lb

## 2024-03-13 DIAGNOSIS — Z8639 Personal history of other endocrine, nutritional and metabolic disease: Secondary | ICD-10-CM | POA: Diagnosis not present

## 2024-03-13 DIAGNOSIS — Z8669 Personal history of other diseases of the nervous system and sense organs: Secondary | ICD-10-CM | POA: Diagnosis not present

## 2024-03-13 DIAGNOSIS — Z0000 Encounter for general adult medical examination without abnormal findings: Secondary | ICD-10-CM

## 2024-03-13 DIAGNOSIS — N924 Excessive bleeding in the premenopausal period: Secondary | ICD-10-CM

## 2024-03-13 DIAGNOSIS — M170 Bilateral primary osteoarthritis of knee: Secondary | ICD-10-CM | POA: Diagnosis not present

## 2024-03-13 DIAGNOSIS — R609 Edema, unspecified: Secondary | ICD-10-CM | POA: Diagnosis not present

## 2024-03-13 DIAGNOSIS — Z634 Disappearance and death of family member: Secondary | ICD-10-CM

## 2024-03-13 DIAGNOSIS — D500 Iron deficiency anemia secondary to blood loss (chronic): Secondary | ICD-10-CM

## 2024-03-13 LAB — POCT URINALYSIS DIP (CLINITEK)
Bilirubin, UA: NEGATIVE
Blood, UA: NEGATIVE
Glucose, UA: NEGATIVE mg/dL
Ketones, POC UA: NEGATIVE mg/dL
Leukocytes, UA: NEGATIVE
Nitrite, UA: NEGATIVE
POC PROTEIN,UA: 100 — AB
Spec Grav, UA: 1.02 (ref 1.010–1.025)
Urobilinogen, UA: 0.2 U/dL
pH, UA: 6.5 (ref 5.0–8.0)

## 2024-03-13 MED ORDER — BUTALBITAL-APAP-CAFF-COD 50-325-40-30 MG PO CAPS: 1.0000 | ORAL_CAPSULE | Freq: Four times a day (QID) | ORAL | 0 refills | Status: AC | PRN

## 2024-03-13 NOTE — Progress Notes (Signed)
 Annual Wellness Visit   Patient Care Team: Perri Ronal PARAS, MD as PCP - General  Visit Date: 03/13/24   Chief Complaint  Patient presents with   Annual Exam   Subjective:  Patient: Andrea Wagner, Female DOB: [birth year], 52 y.o. MRN: [id number]  Andrea Wagner is a 53 y.o. Female who presents today for her Annual Wellness Visit. Patient has History of Migraine Headaches; Vitamin-D Deficiency; Bilateral Knee Pain; Congenital Cavus Deformity, Foot; Plantar Fasciitis, Left; Dependent Edema; and Menorrhagia.  History of Dependent Edema treated with Lasix  20 mg as needed. BP normal  today at 118/80.   History of Migraine Headaches treated with Fioricet 5-325-40 mg every 6 hrs as needed.  Osteoarthritis, Bilateral Knees managed with Ibuprofen  800 mg every 8 hrs as needed.   History of Vitamin-D Deficiency treated with ergocalciferol  50,000 units weekly.   History of Menorrhagia w/ recent CBC indicating anemia with 03/04/2024 CBC, compared to 02/14/2024: HGB 10.3, decreased from 11.8; HCT 33.3, decreased from 11.8; MCH 26.8, decreased from 27.6; MCHC 30.9, decreased from 31.6; MPV 12.6. She says that she was on her cycle when labs were taken. Did have a recent Pelvic US  ordered by Dr. Jeralyn showing 2.7 cm fibroid, endometrium not visualized, and no adnexal pathology.  Recently bereaved after mother's recent passing on 02-29-2024. Counseled on grief and expected effects.   Labs 03/04/2024 CBC, compared to 02/14/2024: HGB 10.3, decreased from 11.8; HCT 33.3, decreased from 11.8; MCH 26.8, decreased from 27.6; MCHC 30.9, decreased from 31.6; MPV 12.6; otherwise WNL.  CMP, compared to 03/2023: Protein 6.0, decreased from 6.4; otherwise WNL.    Lipid Panel: WNL TSH: 0.72  PAP Smear 03/09/2022  normal with repeat recommendation of 2026. Taking Norethindrone  0.35 mg daily.   Mammogram 06/12/2023  normal with repeat recommendation of 2025.  Colonoscopy 03/10/2021 removed 5 small sessile  polyps - 2 from rectum, 3 recto-sigmoid colon; Scattered Diverticulosis throughout entire examined colon w/ more predominant changes in right colon; exam otherwise normal with repeat recommendation of 2032.  Bone Density will be due 2037.   Vaccine Counseling: Due for Covid-19 and Shingles 1/2 - Covid-19 postponed, Shingrix discussed; UTD on Flu and Tdap. 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 Past Medical History:  Diagnosis Date   Migraine    Pyelonephritis    Medical/Surgical History Narrative:   Allergic/Intolerant to: Sulfa Antibiotics - swelling  Past Surgical History:  Procedure Laterality Date   TUBAL LIGATION     WISDOM TOOTH EXTRACTION     Family History  Problem Relation Age of Onset   Hypertension Mother    Diabetes Mother    Diabetes Father    Heart disease Father    Family History Narrative: Father w/ hx of Diabetes and Heart Disease Mother, recently deceased 29-Feb-2024 age 34 due to a compilation of multiple falls leading to head injury and seizures, deceased due to a fall down the stairs, unsure if there was an associated event w/hx of Diabetes, Hypertension, Hip Replacement, and Guillain-Barr Syndrome affecting lower extremities and hands Social History   Social History Narrative   Has a Manufacturing engineer, 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 Non-smoker. Married.   Review of Systems  Constitutional:  Negative for chills, fever, malaise/fatigue and weight loss.  HENT:  Negative for hearing loss, sinus pain and sore throat.   Respiratory:  Negative for cough, hemoptysis and shortness  of breath.   Cardiovascular:  Negative for chest pain, palpitations, leg swelling and PND.  Gastrointestinal:  Negative for abdominal pain, constipation, diarrhea, heartburn, nausea and vomiting.  Genitourinary:  Negative for dysuria, frequency and urgency.  Musculoskeletal:   Negative for back pain, myalgias and neck pain.  Skin:  Negative for itching and rash.  Neurological:  Negative for dizziness, tingling, seizures and headaches.  Endo/Heme/Allergies:  Negative for polydipsia.  Psychiatric/Behavioral:  Negative for depression. The patient is not nervous/anxious.        (+) Bereaved    Objective:  Vitals: BP 118/80   Pulse 78   Ht 5' 6 (1.676 m)   Wt 219 lb (99.3 kg)   LMP 02/28/2024   SpO2 98%   BMI 35.35 kg/m  Physical Exam Vitals and nursing note reviewed.  Constitutional:      General: She is not in acute distress.    Appearance: Normal appearance. She is not ill-appearing or toxic-appearing.  HENT:     Head: Normocephalic and atraumatic.     Right Ear: Hearing, tympanic membrane, ear canal and external ear normal.     Left Ear: Hearing, tympanic membrane, ear canal and external ear normal.     Mouth/Throat:     Pharynx: Oropharynx is clear.  Eyes:     Extraocular Movements: Extraocular movements intact.     Pupils: Pupils are equal, round, and reactive to light.  Neck:     Thyroid: No thyroid mass, thyromegaly or thyroid tenderness.     Vascular: No carotid bruit.  Cardiovascular:     Rate and Rhythm: Normal rate and regular rhythm. No extrasystoles are present.    Pulses:          Dorsalis pedis pulses are 2+ on the right side and 2+ on the left side.     Heart sounds: Normal heart sounds. No murmur heard.    No friction rub. No gallop.  Pulmonary:     Effort: Pulmonary effort is normal.     Breath sounds: Normal breath sounds. No decreased breath sounds, wheezing, rhonchi or rales.  Chest:     Chest wall: No mass.  Abdominal:     Palpations: Abdomen is soft. There is no hepatomegaly, splenomegaly or mass.     Tenderness: There is no abdominal tenderness.     Hernia: No hernia is present.  Musculoskeletal:     Cervical back: Normal range of motion.     Right lower leg: No edema.     Left lower leg: No edema.  Lymphadenopathy:      Cervical: No cervical adenopathy.     Upper Body:     Right upper body: No supraclavicular adenopathy.     Left upper body: No supraclavicular adenopathy.  Skin:    General: Skin is warm and dry.  Neurological:     General: No focal deficit present.     Mental Status: She is alert and oriented to person, place, and time. Mental status is at baseline.     Sensory: Sensation is intact.     Motor: Motor function is intact. No weakness.     Deep Tendon Reflexes: Reflexes are normal and symmetric.  Psychiatric:        Attention and Perception: Attention normal.        Mood and Affect: Mood normal.        Speech: Speech normal.        Behavior: Behavior normal.        Thought  Content: Thought content normal.        Cognition and Memory: Cognition normal.        Judgment: Judgment normal.    Most Recent Fall Risk Assessment:    12/27/2023    2:12 PM  Fall Risk   Falls in the past year? 0  Number falls in past yr: 0  Injury with Fall? 0  Risk for fall due to : No Fall Risks  Follow up Falls evaluation completed   Most Recent Depression Screenings:    02/14/2024   10:53 AM 12/27/2023    2:13 PM  PHQ 2/9 Scores  PHQ - 2 Score 0 0  PHQ- 9 Score 0    Results:  Studies Obtained And Personally Reviewed By Me:  ULTRASOUND OF PELVIS   CLINICAL DATA:  abnormal uterine bleeding, perimenopause    COMPARISON:  None Available.   FINDINGS: Uterusanteverted, 10 x 6 x 5 cm. The endometrium not visualized. Posterior intramural fibroid measures 2.7 x 2.6 x 2.5 cm.   Right ovary   Unremarkable, 2.2 x 1.9 x 1.2 cm.   Left ovary   Unremarkable, 2.3 x 1.6 x 1.6 cm.   Images of the adnexae demonstrated no masses or fluid collections. Color Doppler demonstrated ovarian blood flow.   IMPRESSION: 2.7 cm fibroid. Endometrium not visualized. No adnexal pathology.   PAP Smear 03/09/2022  normal.  Mammogram 06/12/2023  normal.  Colonoscopy 03/10/2021 removed 5 small sessile polyps -  2 from rectum, 3 recto-sigmoid colon; Scattered Diverticulosis throughout entire examined colon w/ more predominant changes in right colon; exam otherwise normal.  Labs:     Component Value Date/Time   NA 142 03/04/2024 0909   K 4.1 03/04/2024 0909   CL 108 03/04/2024 0909   CO2 28 03/04/2024 0909   GLUCOSE 92 03/04/2024 0909   BUN 9 03/04/2024 0909   CREATININE 0.81 03/04/2024 0909   CALCIUM 9.0 03/04/2024 0909   PROT 6.0 (L) 03/04/2024 0909   ALBUMIN 3.7 11/27/2016 0411   AST 13 03/04/2024 0909   ALT 10 03/04/2024 0909   ALKPHOS 83 11/27/2016 0411   BILITOT 0.3 03/04/2024 0909   GFRNONAA 84 11/11/2020 1205   GFRAA 97 11/11/2020 1205    Lab Results  Component Value Date   WBC 6.4 03/04/2024   HGB 10.3 (L) 03/04/2024   HCT 33.3 (L) 03/04/2024   MCV 86.5 03/04/2024   PLT 244 03/04/2024   Lab Results  Component Value Date   CHOL 159 03/04/2024   HDL 70 03/04/2024   LDLCALC 74 03/04/2024   TRIG 71 03/04/2024   CHOLHDL 2.3 03/04/2024   Lab Results  Component Value Date   TSH 0.72 03/04/2024    Assessment & Plan:   Orders Placed This Encounter  Procedures   POCT URINALYSIS DIP (CLINITEK)   No orders of the defined types were placed in this encounter.  Other Labs Reviewed today: CBC, compared to 02/14/2024: HGB 10.3, decreased from 11.8; HCT 33.3, decreased from 11.8; MCH 26.8, decreased from 27.6; MCHC 30.9, decreased from 31.6; MPV 12.6; otherwise WNL.  CMP, compared to 03/2023: Protein 6.0, decreased from 6.4; otherwise WNL.   Lipid Panel: WNL TSH: 0.72  Dependent Edema treated with Lasix  20 mg as needed. Blood Pressure: normotensive today at 118/80.   Migraine Headaches treated with Fioricet 5-325-40 mg every 6 hrs as needed. Refilled Fioricet today.  Osteoarthritis, Bilateral Knees managed with Ibuprofen  800 mg every 8 hrs as needed.   Vitamin-D Deficiency treated with  ergocalciferol  50,000 units weekly.   Menorrhagia w/ recent CBC indicating anemia with  03/04/2024 CBC, compared to 02/14/2024: HGB 10.3, decreased from 11.8; HCT 33.3, decreased from 11.8; MCH 26.8, decreased from 27.6; MCHC 30.9, decreased from 31.6; MPV 12.6. She says that she was on her cycle when labs were taken. Did have a recent Pelvic US  ordered by Dr. Jeralyn showing 2.7 cm fibroid, endometrium not visualized, and no adnexal pathology. Follows-up with Dr. Jeralyn on 7/29.   Bereavement: due to mother's recent passing on 6/15. Counseled on grief and expected effects.   PAP Smear 03/09/2022  normal with repeat recommendation of 2026. Taking Norethindrone  0.35 mg daily.   Mammogram 06/12/2023  normal with repeat recommendation of 2025.  Colonoscopy 03/10/2021 removed 5 small sessile polyps - 2 from rectum, 3 recto-sigmoid colon; Scattered Diverticulosis throughout entire examined colon w/ more predominant changes in right colon; exam otherwise normal with repeat recommendation of 2032.  Bone Density will be due 2037.   Vaccine Counseling: Due for Covid-19 and Shingles 1/2 - Covid-19 postponed, Shingrix discussed; UTD on Flu and Tdap. 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  Return in 14 weeks (on 06/19/2024) for f/u labs and then on 06/20/2024 for follow-up visit, or as needed.   Annual wellness visit done today including the all of the following: Reviewed patient's Family Medical History Reviewed and updated list of patient's medical providers Assessment of cognitive impairment was done Assessed patient's functional ability Established a written schedule for health screening services Health Risk Assessent Completed and Reviewed  Discussed health benefits of physical activity, and encouraged her to engage in regular exercise appropriate for her age and condition.    I,Andrea Wagner,acting as a Neurosurgeon for Ronal JINNY Hailstone, MD.,have documented all relevant documentation on the behalf of Ronal JINNY Hailstone, MD,as directed  by  Ronal JINNY Hailstone, MD while in the presence of Ronal JINNY Hailstone, MD.   I, Ronal JINNY Hailstone, MD, have reviewed all documentation for this visit. The documentation on 03/23/24 for the exam, diagnosis, procedures, and orders are all accurate and complete.

## 2024-03-14 ENCOUNTER — Other Ambulatory Visit (HOSPITAL_COMMUNITY): Payer: Self-pay

## 2024-03-14 ENCOUNTER — Telehealth: Payer: Self-pay

## 2024-03-14 NOTE — Telephone Encounter (Addendum)
 Pharmacy Patient Advocate Encounter   Received notification from CoverMyMeds that prior authorization for Butalbital -APAP-Caff-Cod 50-325-40-30MG  capsules  is required/requested.   Insurance verification completed.   The patient is insured through [employer] .   Per test claim: PA required; PA submitted to above mentioned insurance via CoverMyMeds Key/confirmation #/EOC [id number] Status is pending

## 2024-03-17 NOTE — Telephone Encounter (Signed)
 PLEASE BE ADVISED Clinical questions have been answered and PA submitted.TO PLAN. PA currently Pending.

## 2024-03-23 NOTE — Patient Instructions (Addendum)
 Patient would like to follow up on iron deficiency here in October 2025. She feels hemoglobin will soon improve since  will be seeing Dr. Jeralyn on July 29. No change in other meds. Continue iron supplementation.  We are sorry to hear of the passing of your mother. We wish you peace and comfort.

## 2024-03-25 ENCOUNTER — Other Ambulatory Visit (HOSPITAL_COMMUNITY): Payer: Self-pay

## 2024-03-25 NOTE — Telephone Encounter (Signed)
 Pharmacy Patient Advocate Encounter  Received notification from [employer] that Prior Authorization for Butalbital -APAP-Caff-Cod 50-325-40-30MG  capsules has been DENIED.  Full denial letter will be uploaded to the media tab. See denial reason below.    PA #/Case ID/Reference #: [id number]

## 2024-04-01 ENCOUNTER — Other Ambulatory Visit: Payer: Self-pay

## 2024-04-01 ENCOUNTER — Ambulatory Visit: Admitting: Obstetrics and Gynecology

## 2024-04-01 VITALS — BP 112/74 | HR 83 | Wt 219.0 lb

## 2024-04-01 DIAGNOSIS — Z1331 Encounter for screening for depression: Secondary | ICD-10-CM

## 2024-04-01 DIAGNOSIS — N939 Abnormal uterine and vaginal bleeding, unspecified: Secondary | ICD-10-CM

## 2024-04-01 MED ORDER — NORETHINDRONE ACETATE 5 MG PO TABS: 5.0000 mg | ORAL_TABLET | Freq: Every day | ORAL | 11 refills | Status: AC

## 2024-04-01 NOTE — Progress Notes (Unsigned)
    GYNECOLOGY VISIT  Patient name: Niala Stcharles MRN [id number]  Date of birth: February 19, 1971 Chief Complaint:   Follow-up  History:  Araya Roel is a 53 y.o. G2P2002 being seen today for AUB follow up. On micronor  changing pad 2-3x/day. Ok with current bleeding pattern.   Past Medical History:  Diagnosis Date   Migraine    Pyelonephritis     Past Surgical History:  Procedure Laterality Date   TUBAL LIGATION     WISDOM TOOTH EXTRACTION      The following portions of the patient's history were reviewed and updated as appropriate: allergies, current medications, past family history, past medical history, past social history, past surgical history and problem list.   Health Maintenance:   Last pap     Component Value Date/Time   DIAGPAP  03/09/2022 1133    - Negative for intraepithelial lesion or malignancy (NILM)   DIAGPAP  11/08/2018 0000    NEGATIVE FOR INTRAEPITHELIAL LESIONS OR MALIGNANCY.   HPVHIGH Negative 03/09/2022 1133   ADEQPAP  03/09/2022 1133    Satisfactory for evaluation; transformation zone component PRESENT.   ADEQPAP  11/08/2018 0000    Satisfactory for evaluation  endocervical/transformation zone component PRESENT.    Last mammogram: 06/2023 BIRADS 1   Review of Systems:  Pertinent items are noted in HPI. Comprehensive review of systems was otherwise negative.   Objective:  Physical Exam BP 112/74   Pulse 83   Wt 219 lb (99.3 kg)   LMP 02/28/2024   BMI 35.35 kg/m    Physical Exam   Labs and Imaging US  PELVIC COMPLETE WITH TRANSVAGINAL Result Date: 03/12/2024 CLINICAL DATA:  abnormal uterine bleeding, perimenopause EXAM: ULTRASOUND OF PELVIS TECHNIQUE: Transabdominal and transvaginalultrasound examination of the pelvis was performed including evaluation of the uterus, ovaries, adnexal regions, and pelvic cul-de-sac. COMPARISON:  None Available. FINDINGS: Uterusanteverted, 10 x 6 x 5 cm. The endometrium not visualized. Posterior  intramural fibroid measures 2.7 x 2.6 x 2.5 cm. Right ovary Unremarkable, 2.2 x 1.9 x 1.2 cm. Left ovary Unremarkable, 2.3 x 1.6 x 1.6 cm. Images of the adnexae demonstrated no masses or fluid collections. Color Doppler demonstrated ovarian blood flow. IMPRESSION: 2.7 cm fibroid. Endometrium not visualized. No adnexal pathology. Electronically Signed   By: Fonda Field M.D.   On: 03/12/2024 00:04       Assessment & Plan:   1. Abnormal uterine bleeding (AUB) (Primary) Reviewed pelvic US . Continues to have bleeding with micronor , would be interested in no bleeding, will trial aygestin . Slight drop in hemoglobin from bleeding, anticipate rebound if able to achieved amenorrhea.  - norethindrone  (AYGESTIN ) 5 MG tablet; Take 1 tablet (5 mg total) by mouth daily.  Dispense: 30 tablet; Refill: 11  Routine preventative health maintenance measures emphasized.  Carter Quarry, MD Minimally Invasive Gynecologic Surgery Center for [facility], [facility]

## 2024-06-17 ENCOUNTER — Other Ambulatory Visit

## 2024-06-19 ENCOUNTER — Other Ambulatory Visit

## 2024-06-19 DIAGNOSIS — D649 Anemia, unspecified: Secondary | ICD-10-CM

## 2024-06-19 NOTE — Progress Notes (Signed)
 Lab only

## 2024-06-20 ENCOUNTER — Encounter: Payer: Self-pay | Admitting: Internal Medicine

## 2024-06-20 ENCOUNTER — Ambulatory Visit (INDEPENDENT_AMBULATORY_CARE_PROVIDER_SITE_OTHER): Admitting: Internal Medicine

## 2024-06-20 VITALS — BP 100/70 | HR 83 | Ht 66.0 in | Wt 216.0 lb

## 2024-06-20 DIAGNOSIS — Z23 Encounter for immunization: Secondary | ICD-10-CM

## 2024-06-20 DIAGNOSIS — Z8639 Personal history of other endocrine, nutritional and metabolic disease: Secondary | ICD-10-CM

## 2024-06-20 DIAGNOSIS — D500 Iron deficiency anemia secondary to blood loss (chronic): Secondary | ICD-10-CM | POA: Diagnosis not present

## 2024-06-20 DIAGNOSIS — N924 Excessive bleeding in the premenopausal period: Secondary | ICD-10-CM

## 2024-06-20 DIAGNOSIS — E538 Deficiency of other specified B group vitamins: Secondary | ICD-10-CM | POA: Diagnosis not present

## 2024-06-20 DIAGNOSIS — Z8669 Personal history of other diseases of the nervous system and sense organs: Secondary | ICD-10-CM

## 2024-06-20 DIAGNOSIS — E611 Iron deficiency: Secondary | ICD-10-CM

## 2024-06-20 NOTE — Progress Notes (Signed)
 Patient Care Team: Perri Ronal PARAS, MD as PCP - General  Visit Date: 06/20/24  Subjective:    Patient ID: Andrea Wagner , Female   DOB: [birth year], 53 y.o.    MRN: [id number]   53 y.o. Female presents today for 3 month follow up. Patient has a past medical history of  Migraine Headaches; Vitamin-D Deficiency; Bilateral Knee Pain; Congenital Cavus Deformity, Foot; Plantar Fasciitis, Left; Dependent Edema; and Menorrhagia.   She says that she feels well.    History of Dependent Edema treated with Lasix  20 mg as needed. BP normal today at 100/70.    History of Migraine Headaches treated with Fioricet 5-325-40 mg every 6 hrs as needed.   History of Osteoarthritis, Bilateral Knees managed with Ibuprofen  800 mg every 8 hrs as needed.    History of Vitamin-D Deficiency treated with ergocalciferol  50,000 units weekly.    History of Menorrhagia. 06/19/2024 Iron, TIBC and Ferritin Panel Iron 93, TIBC 336, %SAT 28, Ferritin 17. Currently on Ferrous sulfate 325 mg daily. She is currently on Aygestin  5 mg.  Has not menstruated in 2 months.  B12 low at 275.   Labs 06/19/2024 WNL    Vaccine counseling: Influenza  vaccine received today. Declined Covid-19. Pneumonia vaccine due.    Follow up in 6 months.  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 Non-smoker. Married.      Review of Systems  All other systems reviewed and are negative.       Objective:   Vitals: BP 100/70   Pulse 83   Ht 5' 6 (1.676 m)   Wt 216 lb (98 kg)   SpO2 98%   BMI 34.86 kg/m    Physical Exam Vitals and nursing note reviewed.       Results:    Labs:        Component Value Date/Time   NA 142 03/04/2024 0909   K 4.1 03/04/2024 0909   CL 108 03/04/2024 0909   CO2 28 03/04/2024 0909   GLUCOSE 92 03/04/2024 0909   BUN 9 03/04/2024 0909   CREATININE 0.81 03/04/2024 0909   CALCIUM 9.0 03/04/2024 0909   PROT 6.0 (L) 03/04/2024 0909   ALBUMIN 3.7 11/27/2016 0411   AST 13 03/04/2024 0909   ALT 10 03/04/2024 0909   ALKPHOS 83 11/27/2016 0411   BILITOT 0.3 03/04/2024 0909   GFRNONAA 84 11/11/2020 1205   GFRAA 97 11/11/2020 1205     Lab Results  Component Value Date   WBC 6.4 03/04/2024   HGB 10.3 (L) 03/04/2024   HCT 33.3 (L) 03/04/2024   MCV 86.5 03/04/2024   PLT 244 03/04/2024    Lab Results  Component Value Date   CHOL 159 03/04/2024   HDL 70 03/04/2024   LDLCALC 74 03/04/2024   TRIG 71 03/04/2024   CHOLHDL 2.3 03/04/2024     Lab Results  Component Value Date   TSH 0.72 03/04/2024          Assessment & Plan:   Dependent Edema: treated with Lasix  20 mg as needed. BP  normal  today at 100/70.    Migraine Headaches: treated with Fioricet 5-325-40 mg every 6 hrs as needed.   Osteoarthritis, Bilateral Knees: managed with Ibuprofen  800 mg every 8 hrs as needed.    Vitamin-D Deficiency: treated with ergocalciferol  50,000 units weekly.    Menorrhagia: 06/19/2024 Iron, TIBC and Ferritin Panel Iron 93, TIBC 336, %SAT 28, Ferritin 17. Currently on Ferrous sulfate 325 mg daily. She is currently on Aygestin  5 mg.  Has not had a period in 2 months.  B12 low at 275.   One vial of B12 IM 1 cc injected monthly prescribed.    Vaccine counseling: Influenza vaccine received today. Declined Covid-19. Pneumonia vaccine due.   I,Makayla C Reid,acting as a scribe for Ronal JINNY Hailstone, MD.,have documented all relevant documentation on the behalf of Ronal JINNY Hailstone, MD,as directed by  Ronal JINNY Hailstone, MD while in the presence of Ronal JINNY Hailstone, MD.

## 2024-06-21 LAB — CBC WITH DIFFERENTIAL/PLATELET
Absolute Lymphocytes: 1669 {cells}/uL (ref 850–3900)
Absolute Monocytes: 391 {cells}/uL (ref 200–950)
Basophils Absolute: 43 {cells}/uL (ref 0–200)
Basophils Relative: 0.6 %
Eosinophils Absolute: 163 {cells}/uL (ref 15–500)
Eosinophils Relative: 2.3 %
HCT: 40.5 % (ref 35.0–45.0)
Hemoglobin: 12.5 g/dL (ref 11.7–15.5)
MCH: 26.4 pg — ABNORMAL LOW (ref 27.0–33.0)
MCHC: 30.9 g/dL — ABNORMAL LOW (ref 32.0–36.0)
MCV: 85.6 fL (ref 80.0–100.0)
MPV: 13.7 fL — ABNORMAL HIGH (ref 7.5–12.5)
Monocytes Relative: 5.5 %
Neutro Abs: 4835 {cells}/uL (ref 1500–7800)
Neutrophils Relative %: 68.1 %
Platelets: 218 Thousand/uL (ref 140–400)
RBC: 4.73 Million/uL (ref 3.80–5.10)
RDW: 14.3 % (ref 11.0–15.0)
Total Lymphocyte: 23.5 %
WBC: 7.1 Thousand/uL (ref 3.8–10.8)

## 2024-06-21 LAB — TEST AUTHORIZATION

## 2024-06-21 LAB — RETICULOCYTES
ABS Retic: 59930 {cells}/uL (ref 20000–80000)
Retic Ct Pct: 1.3 %

## 2024-06-21 LAB — B12 AND FOLATE PANEL
Folate: 7.3 ng/mL
Vitamin B-12: 275 pg/mL (ref 200–1100)

## 2024-06-21 LAB — IRON,TIBC AND FERRITIN PANEL
%SAT: 28 % (ref 16–45)
Ferritin: 17 ng/mL (ref 16–232)
Iron: 93 ug/dL (ref 45–160)
TIBC: 336 ug/dL (ref 250–450)

## 2024-06-25 ENCOUNTER — Encounter: Payer: Self-pay | Admitting: Internal Medicine

## 2024-06-25 DIAGNOSIS — E611 Iron deficiency: Secondary | ICD-10-CM | POA: Insufficient documentation

## 2024-06-25 NOTE — Patient Instructions (Addendum)
 Start monthly B12 injections. Continue iron supplement.. Follow up in April. Continue Vitamin D  supplement.Iron deficiency has improved. It was a pleasure to see you today.

## 2024-07-23 ENCOUNTER — Other Ambulatory Visit: Payer: Self-pay | Admitting: Internal Medicine

## 2024-07-23 DIAGNOSIS — Z1231 Encounter for screening mammogram for malignant neoplasm of breast: Secondary | ICD-10-CM

## 2024-08-04 ENCOUNTER — Other Ambulatory Visit: Payer: Self-pay | Admitting: Internal Medicine

## 2024-08-20 ENCOUNTER — Inpatient Hospital Stay: Admission: RE | Admit: 2024-08-20 | Discharge: 2024-08-20 | Attending: Internal Medicine | Admitting: Internal Medicine

## 2024-08-20 DIAGNOSIS — Z1231 Encounter for screening mammogram for malignant neoplasm of breast: Secondary | ICD-10-CM

## 2024-12-23 ENCOUNTER — Other Ambulatory Visit: Payer: Self-pay

## 2024-12-25 ENCOUNTER — Ambulatory Visit: Payer: Self-pay | Admitting: Internal Medicine
